# Patient Record
Sex: Male | Born: 1990
Health system: Southern US, Community
[De-identification: ages and names within clinical notes are randomized; demographics above are authoritative.]

## PROBLEM LIST (undated history)

## (undated) DIAGNOSIS — I429 Cardiomyopathy, unspecified: Secondary | ICD-10-CM

## (undated) HISTORY — PX: CARDIAC DEFIBRILLATOR PLACEMENT: SHX171

## (undated) HISTORY — PX: CARDIAC CATHETERIZATION: SHX172

## (undated) HISTORY — PX: MYRINGOTOMY: SUR874

---
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
## 2008-02-21 ENCOUNTER — Emergency Department (HOSPITAL_COMMUNITY): Admission: EM | Admit: 2008-02-21 | Discharge: 2008-02-21 | Payer: Self-pay | Admitting: Emergency Medicine

## 2009-03-07 ENCOUNTER — Emergency Department (HOSPITAL_COMMUNITY): Admission: EM | Admit: 2009-03-07 | Discharge: 2009-03-07 | Payer: Self-pay | Admitting: Emergency Medicine

## 2009-09-29 ENCOUNTER — Emergency Department (HOSPITAL_COMMUNITY): Admission: EM | Admit: 2009-09-29 | Discharge: 2009-09-29 | Payer: Self-pay | Admitting: Emergency Medicine

## 2009-12-05 ENCOUNTER — Ambulatory Visit (HOSPITAL_COMMUNITY): Admission: RE | Admit: 2009-12-05 | Discharge: 2009-12-05 | Payer: Self-pay | Admitting: Family Medicine

## 2010-02-20 ENCOUNTER — Emergency Department (HOSPITAL_COMMUNITY): Admission: EM | Admit: 2010-02-20 | Discharge: 2010-02-21 | Payer: Self-pay | Admitting: Emergency Medicine

## 2010-10-30 ENCOUNTER — Emergency Department (HOSPITAL_COMMUNITY)
Admission: EM | Admit: 2010-10-30 | Discharge: 2010-10-30 | Disposition: A | Discharge status: Home / Self Care | Payer: Self-pay | Attending: Emergency Medicine | Admitting: Emergency Medicine

## 2010-10-30 ENCOUNTER — Emergency Department (HOSPITAL_COMMUNITY): Payer: Self-pay

## 2010-10-30 DIAGNOSIS — R0789 Other chest pain: Secondary | ICD-10-CM | POA: Insufficient documentation

## 2010-10-30 DIAGNOSIS — I428 Other cardiomyopathies: Secondary | ICD-10-CM | POA: Insufficient documentation

## 2010-10-30 DIAGNOSIS — R0602 Shortness of breath: Secondary | ICD-10-CM | POA: Insufficient documentation

## 2010-10-30 DIAGNOSIS — I10 Essential (primary) hypertension: Secondary | ICD-10-CM | POA: Insufficient documentation

## 2010-10-30 DIAGNOSIS — Z950 Presence of cardiac pacemaker: Secondary | ICD-10-CM | POA: Insufficient documentation

## 2010-10-30 LAB — DIFFERENTIAL
Basophils Absolute: 0 10*3/uL (ref 0.0–0.1)
Basophils Relative: 0 % (ref 0–1)
Eosinophils Absolute: 0.2 10*3/uL (ref 0.0–0.7)
Eosinophils Relative: 3 % (ref 0–5)
Lymphocytes Relative: 33 % (ref 12–46)
Lymphs Abs: 2.3 10*3/uL (ref 0.7–4.0)
Monocytes Absolute: 0.6 10*3/uL (ref 0.1–1.0)
Monocytes Relative: 8 % (ref 3–12)
Neutro Abs: 3.9 10*3/uL (ref 1.7–7.7)
Neutrophils Relative %: 56 % (ref 43–77)

## 2010-10-30 LAB — BRAIN NATRIURETIC PEPTIDE: Pro B Natriuretic peptide (BNP): <30 pg/mL (ref 0.0–100.0)

## 2010-10-30 LAB — POCT CARDIAC MARKERS
CKMB, poc: <1 ng/mL — ABNORMAL LOW (ref 1.0–8.0)
CKMB, poc: <1 ng/mL — ABNORMAL LOW (ref 1.0–8.0)
Myoglobin, poc: 41.9 ng/mL (ref 12–200)
Myoglobin, poc: 44.7 ng/mL (ref 12–200)
Troponin i, poc: <0.05 ng/mL (ref 0.00–0.09)
Troponin i, poc: <0.05 ng/mL (ref 0.00–0.09)

## 2010-10-30 LAB — BASIC METABOLIC PANEL
BUN: 12 mg/dL (ref 6–23)
CO2: 29 mEq/L (ref 19–32)
Calcium: 9.4 mg/dL (ref 8.4–10.5)
Chloride: 101 mEq/L (ref 96–112)
Creatinine, Ser: 0.99 mg/dL (ref 0.4–1.5)
GFR calc Af Amer: >60 mL/min (ref >60)
GFR calc non Af Amer: >60 mL/min (ref >60)
Glucose, Bld: 90 mg/dL (ref 70–99)
Potassium: 4 mEq/L (ref 3.5–5.1)
Sodium: 138 mEq/L (ref 135–145)

## 2010-10-30 LAB — CBC
HCT: 45.9 % (ref 39.0–52.0)
Hemoglobin: 16 g/dL (ref 13.0–17.0)
MCH: 29.6 pg (ref 26.0–34.0)
MCHC: 34.9 g/dL (ref 30.0–36.0)
MCV: 84.8 fL (ref 78.0–100.0)
Platelets: 180 10*3/uL (ref 150–400)
RBC: 5.41 MIL/uL (ref 4.22–5.81)
RDW: 11.7 % (ref 11.5–15.5)
WBC: 6.9 10*3/uL (ref 4.0–10.5)

## 2010-11-02 ENCOUNTER — Emergency Department (HOSPITAL_COMMUNITY): Payer: Self-pay

## 2010-11-02 ENCOUNTER — Emergency Department (HOSPITAL_COMMUNITY)
Admission: EM | Admit: 2010-11-02 | Discharge: 2010-11-02 | Disposition: A | Discharge status: Home / Self Care | Payer: Self-pay | Attending: Emergency Medicine | Admitting: Emergency Medicine

## 2010-11-02 DIAGNOSIS — R0789 Other chest pain: Secondary | ICD-10-CM | POA: Insufficient documentation

## 2010-11-02 DIAGNOSIS — R112 Nausea with vomiting, unspecified: Secondary | ICD-10-CM | POA: Insufficient documentation

## 2010-11-02 DIAGNOSIS — Z95810 Presence of automatic (implantable) cardiac defibrillator: Secondary | ICD-10-CM | POA: Insufficient documentation

## 2010-11-02 DIAGNOSIS — I10 Essential (primary) hypertension: Secondary | ICD-10-CM | POA: Insufficient documentation

## 2010-11-02 LAB — DIFFERENTIAL
Basophils Absolute: 0 10*3/uL (ref 0.0–0.1)
Basophils Relative: 0 % (ref 0–1)
Eosinophils Absolute: 0.1 10*3/uL (ref 0.0–0.7)
Eosinophils Relative: 1 % (ref 0–5)
Lymphocytes Relative: 6 % — ABNORMAL LOW (ref 12–46)
Lymphs Abs: 0.8 10*3/uL (ref 0.7–4.0)
Monocytes Absolute: 0.7 10*3/uL (ref 0.1–1.0)
Monocytes Relative: 5 % (ref 3–12)
Neutro Abs: 12.1 10*3/uL — ABNORMAL HIGH (ref 1.7–7.7)
Neutrophils Relative %: 88 % — ABNORMAL HIGH (ref 43–77)

## 2010-11-02 LAB — COMPREHENSIVE METABOLIC PANEL
ALT: 62 U/L — ABNORMAL HIGH (ref 0–53)
AST: 28 U/L (ref 0–37)
Albumin: 4.2 g/dL (ref 3.5–5.2)
Alkaline Phosphatase: 56 U/L (ref 39–117)
BUN: 21 mg/dL (ref 6–23)
CO2: 27 mEq/L (ref 19–32)
Calcium: 9.3 mg/dL (ref 8.4–10.5)
Chloride: 104 mEq/L (ref 96–112)
Creatinine, Ser: 1.01 mg/dL (ref 0.4–1.5)
GFR calc Af Amer: >60 mL/min (ref >60)
GFR calc non Af Amer: >60 mL/min (ref >60)
Glucose, Bld: 103 mg/dL — ABNORMAL HIGH (ref 70–99)
Potassium: 4.2 mEq/L (ref 3.5–5.1)
Sodium: 139 mEq/L (ref 135–145)
Total Bilirubin: 0.7 mg/dL (ref 0.3–1.2)
Total Protein: 7.2 g/dL (ref 6.0–8.3)

## 2010-11-02 LAB — CBC
HCT: 47.9 % (ref 39.0–52.0)
Hemoglobin: 17 g/dL (ref 13.0–17.0)
MCH: 29.7 pg (ref 26.0–34.0)
MCHC: 35.5 g/dL (ref 30.0–36.0)
MCV: 83.7 fL (ref 78.0–100.0)
Platelets: 182 10*3/uL (ref 150–400)
RBC: 5.72 MIL/uL (ref 4.22–5.81)
RDW: 11.7 % (ref 11.5–15.5)
WBC: 13.7 10*3/uL — ABNORMAL HIGH (ref 4.0–10.5)

## 2010-11-02 LAB — URINALYSIS, ROUTINE W REFLEX MICROSCOPIC
Bilirubin Urine: NEGATIVE
Hgb urine dipstick: NEGATIVE
Ketones, ur: NEGATIVE mg/dL
Nitrite: NEGATIVE
Protein, ur: NEGATIVE mg/dL
Specific Gravity, Urine: >1.03 — ABNORMAL HIGH (ref 1.005–1.030)
Urine Glucose, Fasting: NEGATIVE mg/dL
Urobilinogen, UA: 0.2 mg/dL (ref 0.0–1.0)
pH: 5.5 (ref 5.0–8.0)

## 2010-11-02 LAB — LIPASE, BLOOD: Lipase: 29 U/L (ref 11–59)

## 2010-11-27 LAB — POCT CARDIAC MARKERS
CKMB, poc: <1 ng/mL — ABNORMAL LOW (ref 1.0–8.0)
CKMB, poc: <1 ng/mL — ABNORMAL LOW (ref 1.0–8.0)
Myoglobin, poc: 54.7 ng/mL (ref 12–200)
Myoglobin, poc: 68.2 ng/mL (ref 12–200)
Troponin i, poc: <0.05 ng/mL (ref 0.00–0.09)
Troponin i, poc: <0.05 ng/mL (ref 0.00–0.09)

## 2010-11-27 LAB — BASIC METABOLIC PANEL
BUN: 11 mg/dL (ref 6–23)
CO2: 26 mEq/L (ref 19–32)
Calcium: 9.3 mg/dL (ref 8.4–10.5)
Chloride: 107 mEq/L (ref 96–112)
Creatinine, Ser: 1.12 mg/dL (ref 0.4–1.5)
GFR calc Af Amer: >60 mL/min (ref >60)
GFR calc non Af Amer: >60 mL/min (ref >60)
Glucose, Bld: 87 mg/dL (ref 70–99)
Potassium: 3.3 mEq/L — ABNORMAL LOW (ref 3.5–5.1)
Sodium: 139 mEq/L (ref 135–145)

## 2010-11-27 LAB — DIFFERENTIAL
Basophils Absolute: 0 10*3/uL (ref 0.0–0.1)
Basophils Relative: 0 % (ref 0–1)
Eosinophils Absolute: 0.1 10*3/uL (ref 0.0–0.7)
Eosinophils Relative: 1 % (ref 0–5)
Lymphocytes Relative: 23 % (ref 12–46)
Lymphs Abs: 1.5 10*3/uL (ref 0.7–4.0)
Monocytes Absolute: 0.7 10*3/uL (ref 0.1–1.0)
Monocytes Relative: 10 % (ref 3–12)
Neutro Abs: 4.2 10*3/uL (ref 1.7–7.7)
Neutrophils Relative %: 65 % (ref 43–77)

## 2010-11-27 LAB — CBC
HCT: 45.5 % (ref 39.0–52.0)
Hemoglobin: 15.3 g/dL (ref 13.0–17.0)
MCHC: 33.6 g/dL (ref 30.0–36.0)
MCV: 88.4 fL (ref 78.0–100.0)
Platelets: 153 10*3/uL (ref 150–400)
RBC: 5.14 MIL/uL (ref 4.22–5.81)
RDW: 12 % (ref 11.5–15.5)
WBC: 6.4 10*3/uL (ref 4.0–10.5)

## 2010-12-18 LAB — BASIC METABOLIC PANEL
Calcium: 9.7 mg/dL (ref 8.4–10.5)
GFR calc Af Amer: >60 mL/min (ref >60)
GFR calc non Af Amer: >60 mL/min (ref >60)
Glucose, Bld: 83 mg/dL (ref 70–99)
Potassium: 4 mEq/L (ref 3.5–5.1)
Sodium: 138 mEq/L (ref 135–145)

## 2010-12-18 LAB — CBC
HCT: 48.6 % (ref 39.0–52.0)
Hemoglobin: 16.8 g/dL (ref 13.0–17.0)
RBC: 5.45 MIL/uL (ref 4.22–5.81)
WBC: 5.1 10*3/uL (ref 4.0–10.5)

## 2010-12-18 LAB — POCT CARDIAC MARKERS
CKMB, poc: <1 ng/mL — ABNORMAL LOW (ref 1.0–8.0)
Troponin i, poc: <0.05 ng/mL (ref 0.00–0.09)

## 2010-12-18 LAB — DIFFERENTIAL
Eosinophils Relative: 1 % (ref 0–5)
Lymphocytes Relative: 27 % (ref 12–46)
Lymphs Abs: 1.4 10*3/uL (ref 0.7–4.0)
Monocytes Absolute: 0.5 10*3/uL (ref 0.1–1.0)
Monocytes Relative: 9 % (ref 3–12)
Neutro Abs: 3.2 10*3/uL (ref 1.7–7.7)

## 2011-06-05 LAB — BASIC METABOLIC PANEL
BUN: 13
Chloride: 100
Creatinine, Ser: 1.34
GFR calc Af Amer: >60
GFR calc non Af Amer: >60
Potassium: 3.1 — ABNORMAL LOW

## 2011-06-05 LAB — CBC
HCT: 46.3
MCV: 88
Platelets: 216
RBC: 5.26
WBC: 10.9 — ABNORMAL HIGH

## 2011-06-05 LAB — DIFFERENTIAL
Basophils Relative: 0
Eosinophils Absolute: 0.1
Monocytes Relative: 7
Neutro Abs: 6.1
Neutrophils Relative %: 56

## 2011-06-05 LAB — CK TOTAL AND CKMB (NOT AT ARMC)
Relative Index: 1.1
Total CK: 149

## 2011-06-05 LAB — TROPONIN I: Troponin I: 0.01

## 2011-11-27 DIAGNOSIS — Z95810 Presence of automatic (implantable) cardiac defibrillator: Secondary | ICD-10-CM | POA: Insufficient documentation

## 2012-02-20 IMAGING — CR DG CHEST 2V
2 series · 2 of 2 positions shown · non-contrast
Comparison: 02/21/2011.

CLINICAL DATA: Chest pain.

CHEST - 2 VIEW

[view not recorded (1 of 2)]
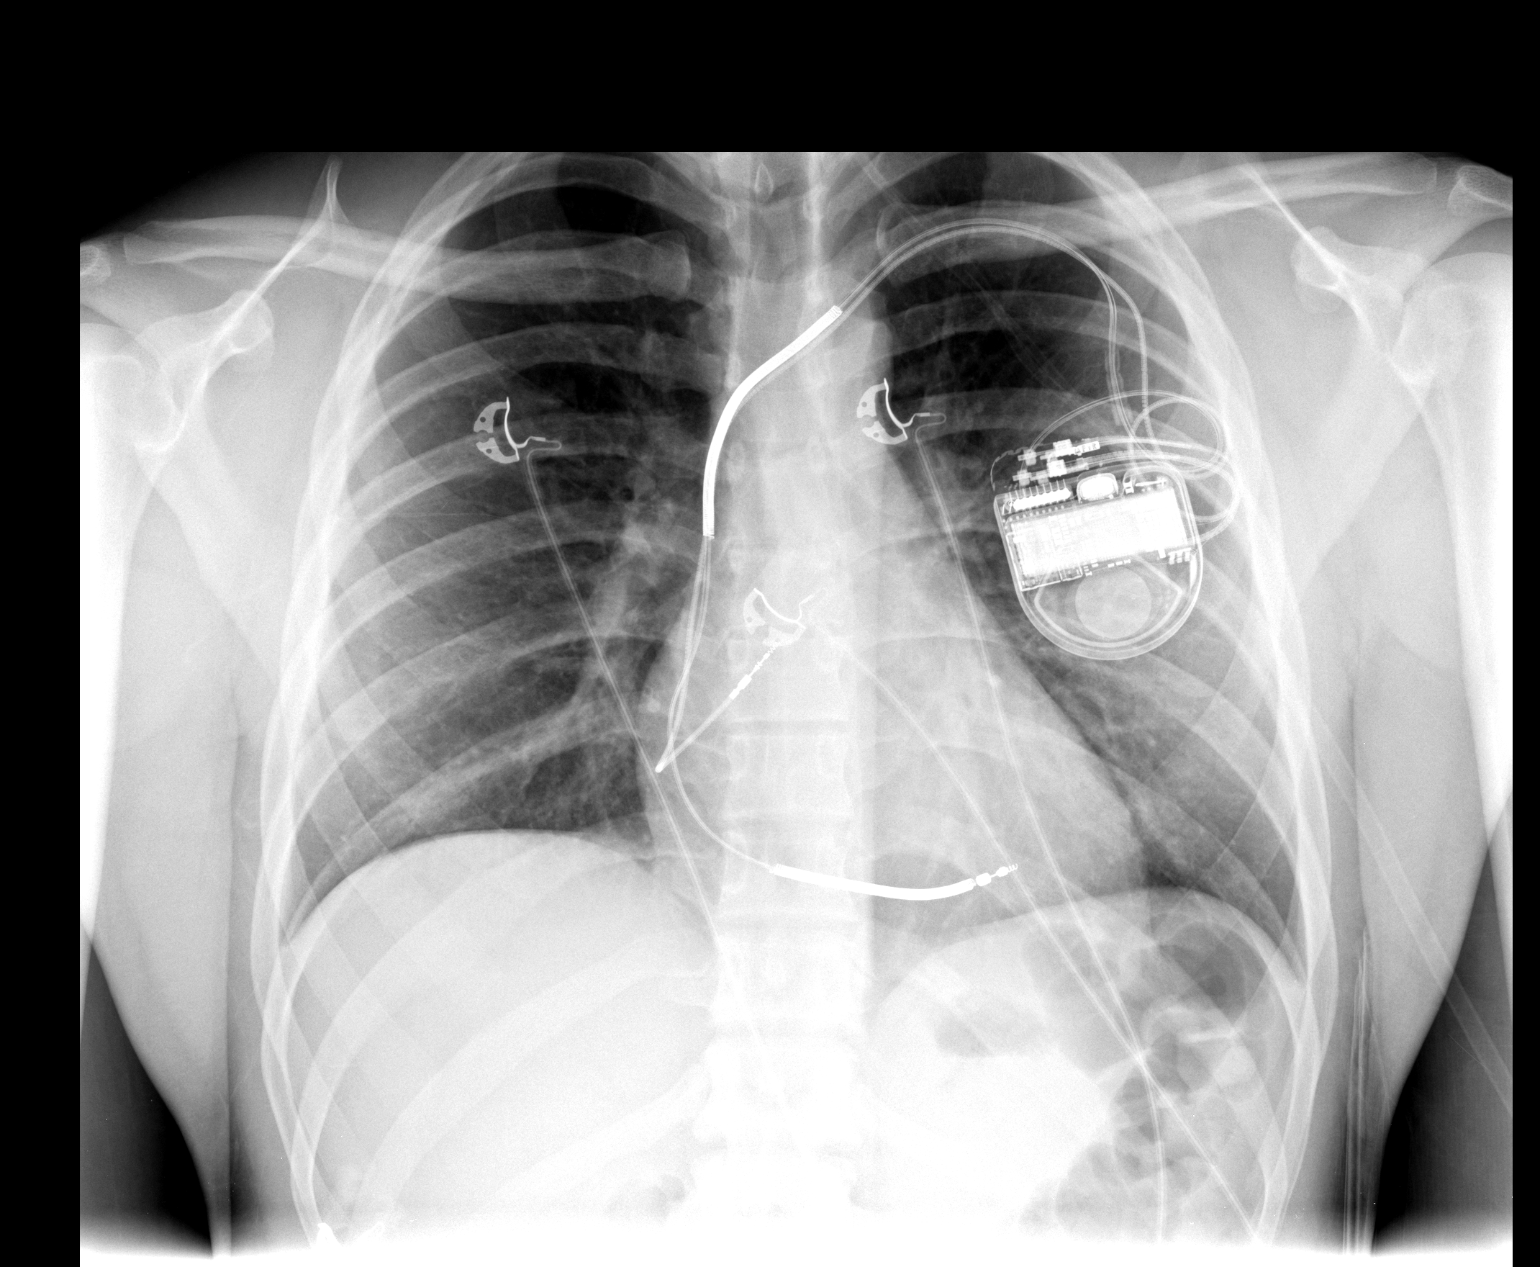

[view not recorded (2 of 2)]
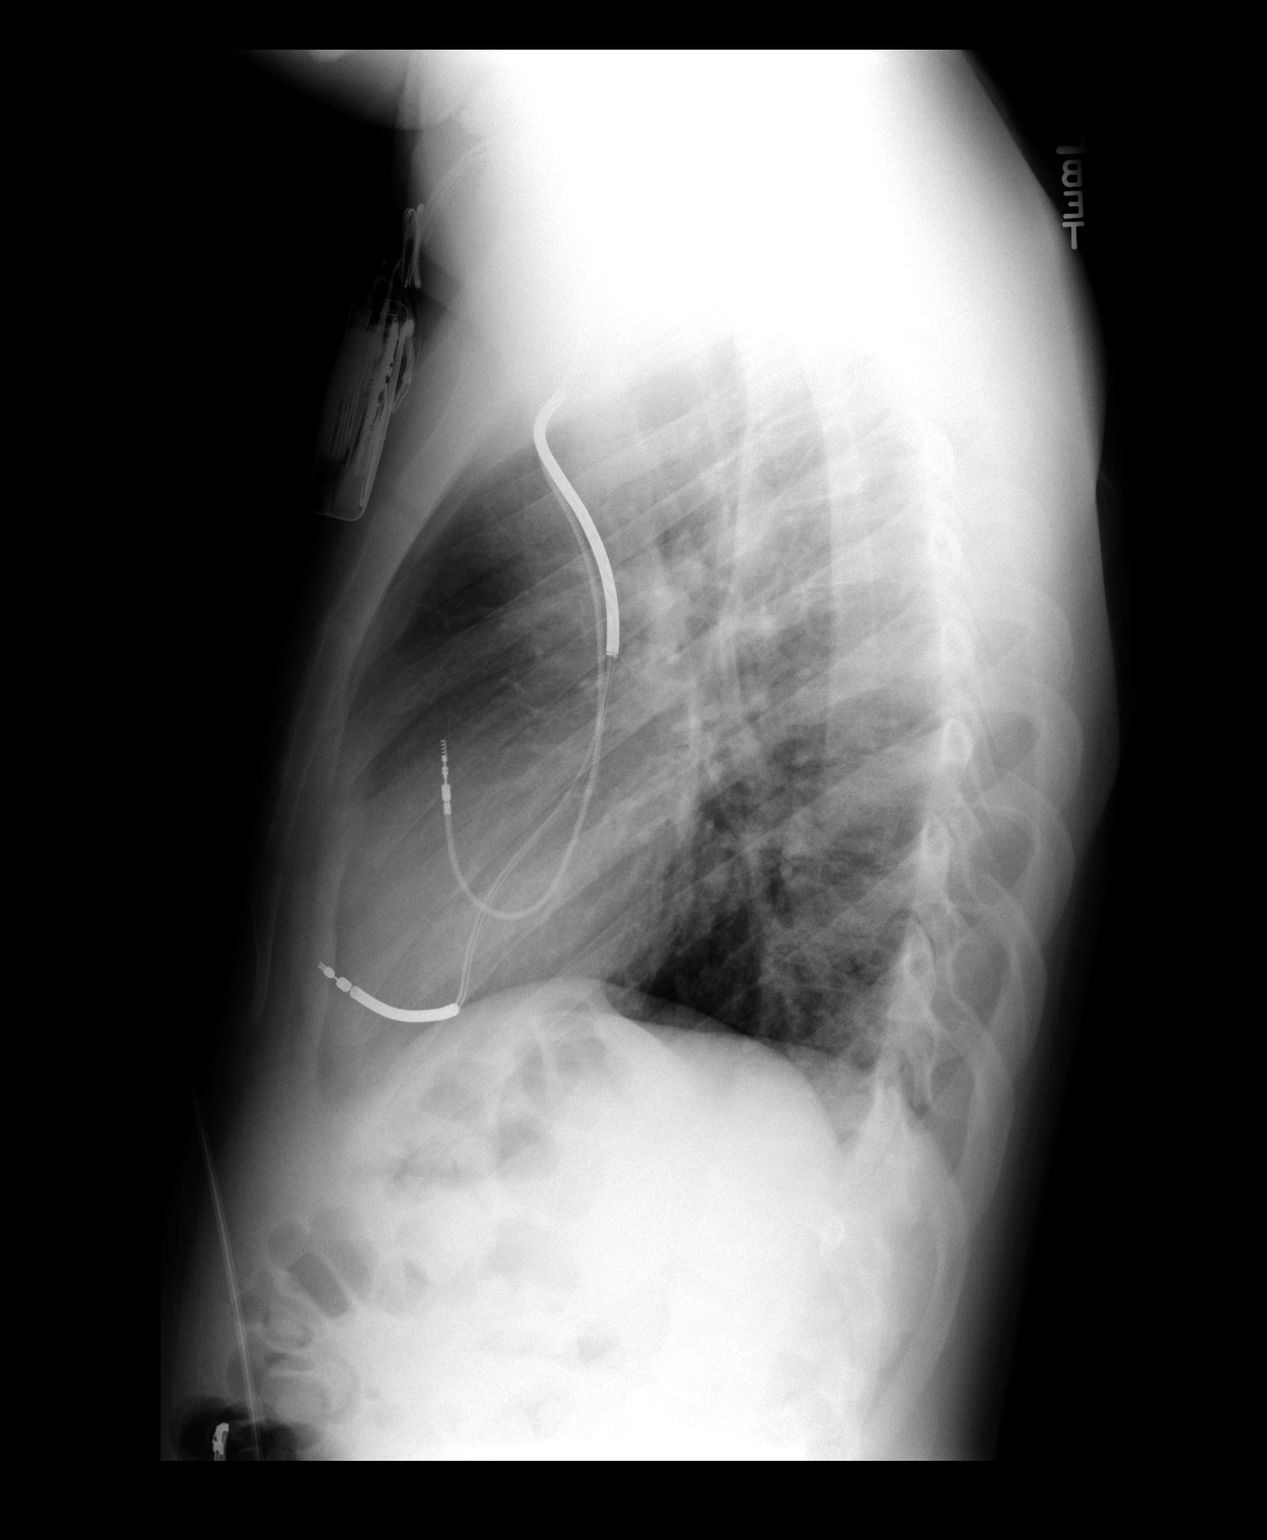

[2 of 2 positions shown; findings below may reference images not displayed]

FINDINGS: Cardiac leads and a left-sided pacemaker are stable in
position.  The heart is within normal limits for size.  The lungs
are clear.  The visualized soft tissues and bony thorax are
unremarkable.
IMPRESSION: No acute cardiopulmonary disease.

## 2012-02-23 IMAGING — CR DG CHEST 2V
2 series · 2 of 2 positions shown · non-contrast
Comparison: 10/30/2010.

CLINICAL DATA: Chest pain.

CHEST - 2 VIEW

[view not recorded (1 of 2)]
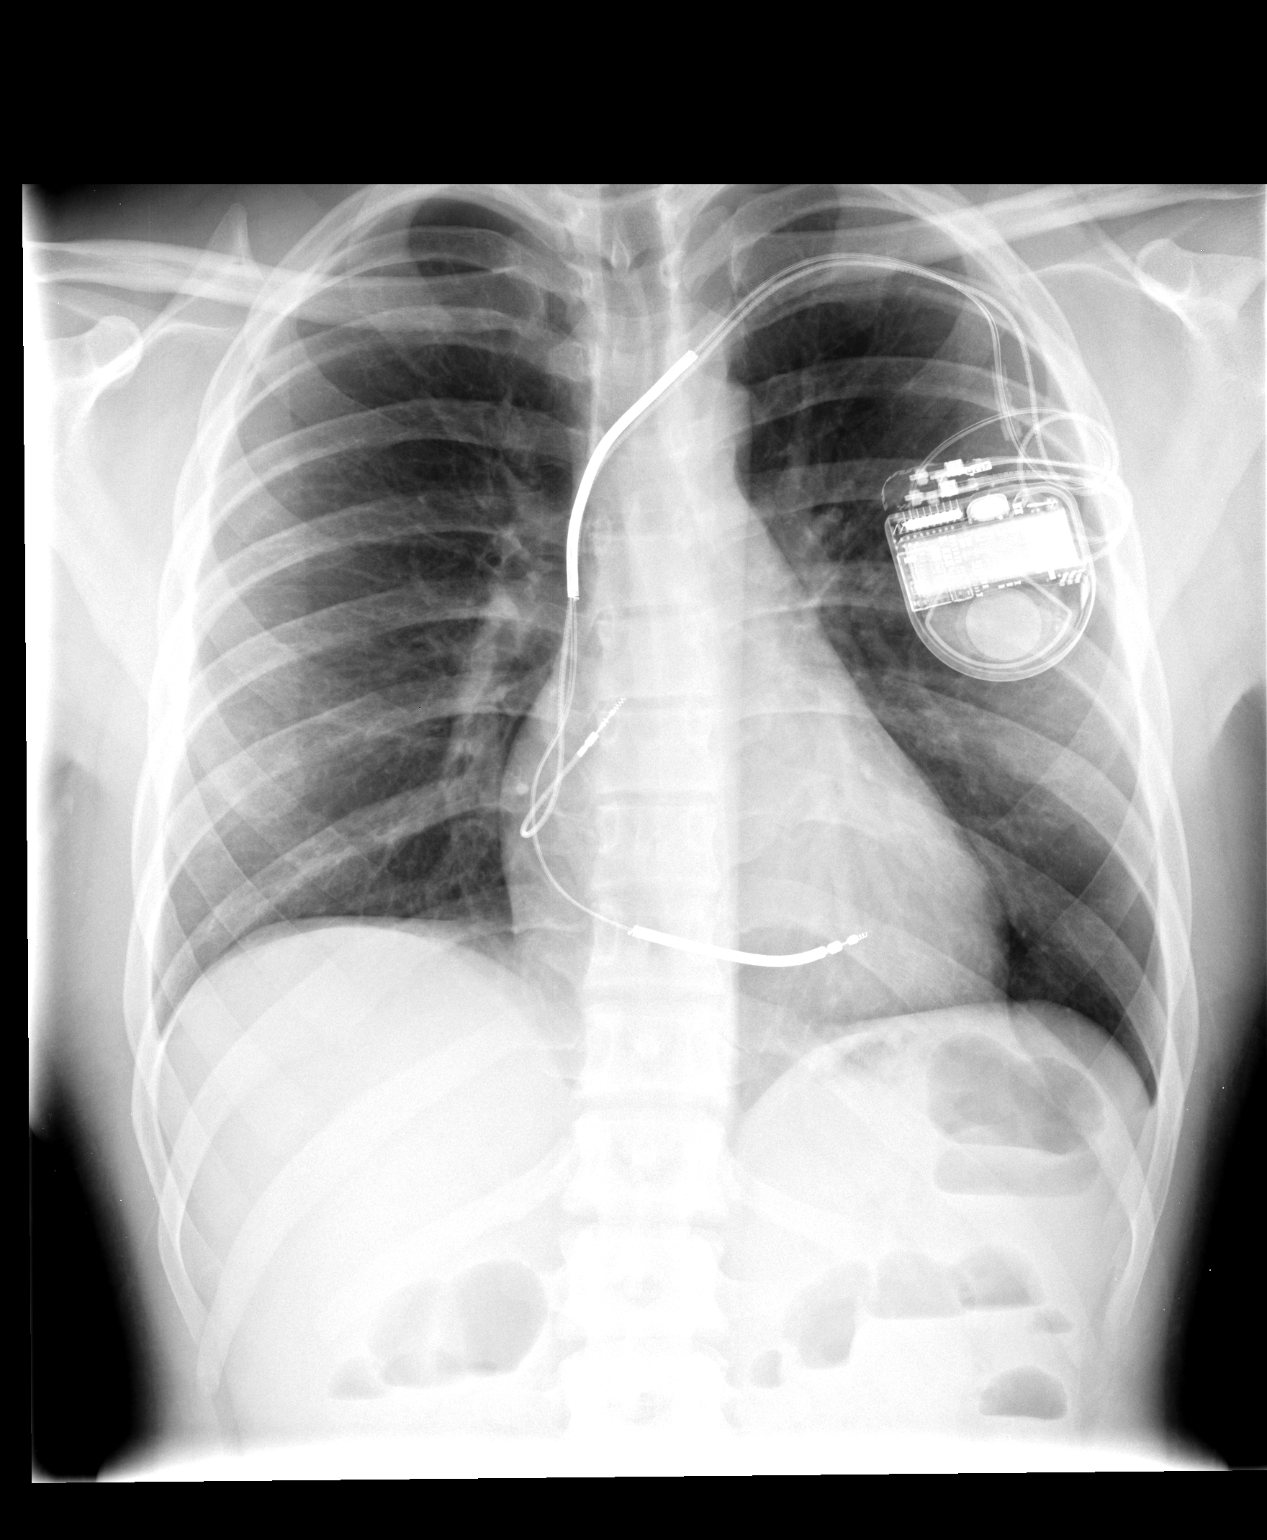

[view not recorded (2 of 2)]
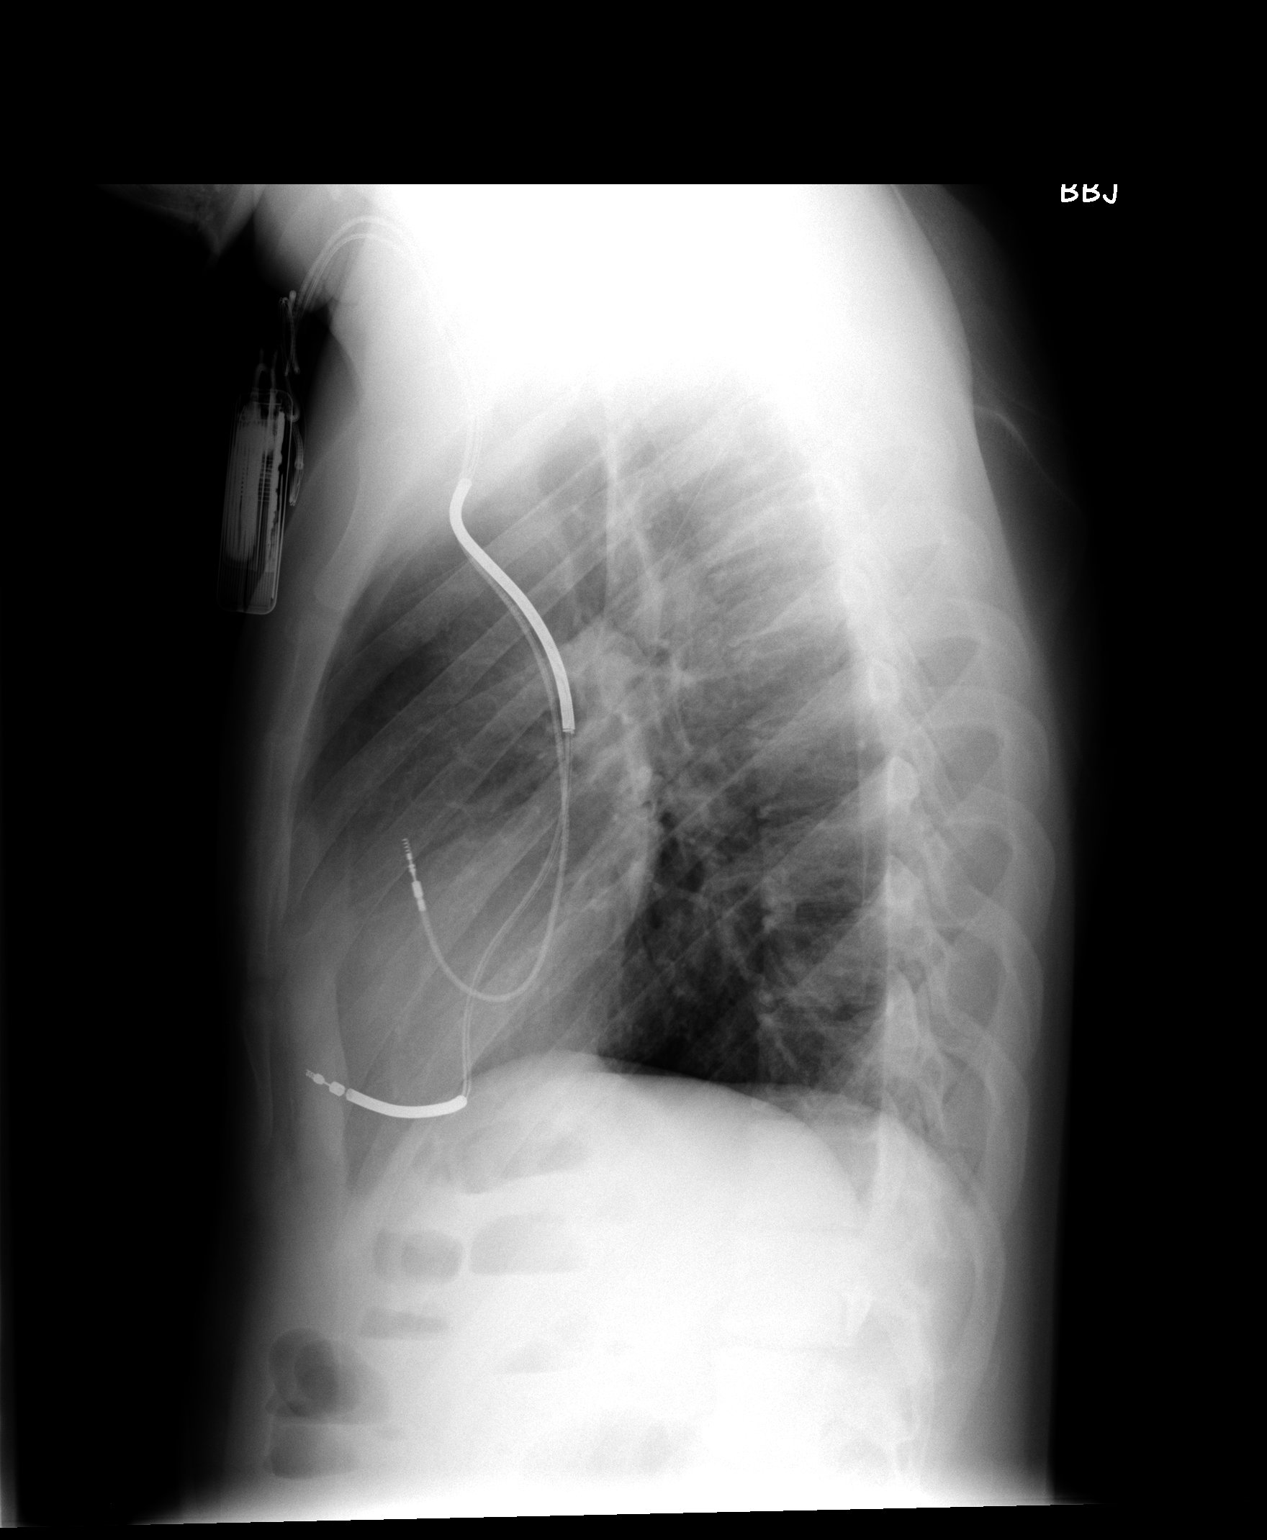

[2 of 2 positions shown; findings below may reference images not displayed]

FINDINGS: Heart size is normal.  Pacing and defibrillator leads are
stable.  The lungs are clear.  The visualized soft tissues and bony
thorax are unremarkable.
IMPRESSION: No acute cardiopulmonary disease or significant interval change.

## 2012-03-17 ENCOUNTER — Emergency Department
Admission: EM | Admit: 2012-03-17 | Discharge: 2012-03-17 | Disposition: A | Discharge status: Home / Self Care | Payer: 59 | Source: Home / Self Care

## 2012-03-17 ENCOUNTER — Encounter: Payer: Self-pay | Admitting: *Deleted

## 2012-03-17 DIAGNOSIS — IMO0002 Reserved for concepts with insufficient information to code with codable children: Secondary | ICD-10-CM

## 2012-03-17 DIAGNOSIS — L0291 Cutaneous abscess, unspecified: Secondary | ICD-10-CM

## 2012-03-17 HISTORY — DX: Cardiomyopathy, unspecified: I42.9

## 2012-03-17 NOTE — ED Notes (Signed)
Pt c/o abscess on his LT forearm x 1wk. He saw his PCP today and was given septra DS he has taken 2 tablets today. He is concerned that there is more swelling and pain.

## 2012-03-17 NOTE — ED Provider Notes (Signed)
History     CSN: [id number]  Arrival date & time 03/17/12  2003   First MD Initiated Contact with Patient 03/17/12 2005      Chief Complaint  Patient presents with  . Insect Bite    (Consider location/radiation/quality/duration/timing/severity/associated sxs/prior treatment) HPI Comments: Pt was seen earlier for this at [facility]  Was rxd bactrim, pt states that he took one dose and noticed worsening erythema since this point.  Abscess was not drained per pt.  Has had some mild elbow pain.  No distal paresthesias.  Full ROM.    Patient is a 21 y.o. male presenting with abscess.  Abscess  This is a new problem. The current episode started less than one week ago. The problem has been gradually worsening. Affected Location: L posterior forearm  The problem is mild. The abscess is characterized by redness, swelling and painfulness. Associated with: ? spider bite  The abscess first occurred at home. Pertinent negatives include no anorexia, no decrease in physical activity, no fever, no diarrhea and no vomiting.    Past Medical History  Diagnosis Date  . Cardiomyopathy     Past Surgical History  Procedure Date  . Cardiac defibrillator placement   . Myringotomy   . Cardiac catheterization     History reviewed. No pertinent family history.  History  Substance Use Topics  . Smoking status: Never Smoker   . Smokeless tobacco: Not on file  . Alcohol Use: No      Review of Systems  Constitutional: Negative for fever.  Gastrointestinal: Negative for vomiting, diarrhea and anorexia.  All other systems reviewed and are negative.    Allergies  Review of patient's allergies indicates no known allergies.  Home Medications   Current Outpatient Rx  Name Route Sig Dispense Refill  . ASPIRIN 81 MG PO TABS Oral Take 81 mg by mouth daily.    Marland Kitchen CARVEDILOL 12.5 MG PO TABS Oral Take 12.5 mg by mouth 2 (two) times daily with a meal.    . SOTALOL HCL 120 MG PO TABS Oral Take 120  mg by mouth 2 (two) times daily.    . SULFAMETHOXAZOLE-TMP DS 800-160 MG PO TABS Oral Take 1 tablet by mouth 2 (two) times daily.      BP 136/82  Pulse 76  Temp 98.7 F (37.1 C) (Oral)  Resp 18  Ht 5\' 5"  (1.651 m)  Wt 170 lb (77.111 kg)  BMI 28.29 kg/m2  SpO2 100%  Physical Exam  Constitutional: He appears well-developed and well-nourished.  HENT:  Head: Normocephalic and atraumatic.  Eyes: Pupils are equal, round, and reactive to light.  Neck: Normal range of motion. Neck supple.  Cardiovascular: Normal rate.   Pulmonary/Chest: Effort normal and breath sounds normal.  Skin:           <0.5 central area of pustular drainage with 2-3 cm area of peripheral erythema and fluctuance.  Elbow Full ROM. Mild elbow tednerness. No crepitus.     ED Course  Procedures (including critical care time)   Labs Reviewed  WOUND CULTURE   No results found.   No diagnosis found.    MDM  Abscess drained at bedside and cultured. Minimal amount of fluid expressed.  Will continue bactrim pending cultures.  Discussed MSK and infectious red flags including worsening elbow pain and forearm swelling.  Handout given. Follow up in 3-5 days if not improved.     The patient and/or caregiver has been counseled thoroughly with regard to treatment plan and/or  medications prescribed including dosage, schedule, interactions, rationale for use, and possible side effects and they verbalize understanding. Diagnoses and expected course of recovery discussed and will return if not improved as expected or if the condition worsens. Patient and/or caregiver verbalized understanding.             Floydene Flock, MD 03/17/12 2104

## 2012-03-18 NOTE — ED Provider Notes (Signed)
Agree with exam, assessment, and plan.   Lattie Haw, MD 03/18/12 1318

## 2012-03-21 ENCOUNTER — Emergency Department (INDEPENDENT_AMBULATORY_CARE_PROVIDER_SITE_OTHER)
Admission: EM | Admit: 2012-03-21 | Discharge: 2012-03-21 | Disposition: A | Discharge status: Home / Self Care | Payer: 59 | Source: Home / Self Care

## 2012-03-21 ENCOUNTER — Encounter: Payer: Self-pay | Admitting: Emergency Medicine

## 2012-03-21 ENCOUNTER — Telehealth: Payer: Self-pay

## 2012-03-21 DIAGNOSIS — L0291 Cutaneous abscess, unspecified: Secondary | ICD-10-CM

## 2012-03-21 DIAGNOSIS — Z950 Presence of cardiac pacemaker: Secondary | ICD-10-CM

## 2012-03-21 DIAGNOSIS — IMO0002 Reserved for concepts with insufficient information to code with codable children: Secondary | ICD-10-CM

## 2012-03-21 LAB — WOUND CULTURE: Gram Stain: NONE SEEN

## 2012-03-21 MED ORDER — DOXYCYCLINE HYCLATE 100 MG PO CAPS: 100.0000 mg | ORAL_CAPSULE | Freq: Two times a day (BID) | ORAL | Status: AC

## 2012-03-21 MED ORDER — CLINDAMYCIN HCL 300 MG PO CAPS: 300.0000 mg | ORAL_CAPSULE | Freq: Three times a day (TID) | ORAL | Status: AC

## 2012-03-21 NOTE — ED Notes (Signed)
I called and spoke with Roger Parsons about his positive wound culture and advised him to finish the full course of antibiotics.

## 2012-03-21 NOTE — ED Notes (Signed)
Seen 03-17-12 for spot on left arm that was infected; now states it is draining a lot and there is another spot on upper arm that has appeared. Reports ABX giving him heartburn.

## 2012-03-21 NOTE — ED Provider Notes (Signed)
History     CSN: [id number]  Arrival date & time 03/21/12  1123   First MD Initiated Contact with Patient 03/21/12 1125      Chief Complaint  Patient presents with  . Wound Check   HPI Comments: Pt also has new area of redness and pustule formation on L upper arm.  Pt with baseline hx/o pace maker placement 3-4 months ago for cardiomyopathy.  No fevers or chills at home.  Pt does report feeling warm 1-2 days prior to this visit. Pt also states that bactrim has given him feeling of nausea.      Patient is a 21 y.o. male presenting with wound check.  Wound Check  He was treated in the ED 3 to 5 days ago. Previous treatment in the ED includes I&D of abscess. Treatments since wound repair include oral antibiotics. Fever duration: no fever  Wound drainage status: seropurulent  The redness has worsened. Swelling Status: mild  Pain course: mild  He has no difficulty moving (no elbow pain or swelling. Area of I&D has reaccumulated purulent fluid. ) the affected extremity or digit.    Past Medical History  Diagnosis Date  . Cardiomyopathy     Past Surgical History  Procedure Date  . Cardiac defibrillator placement   . Myringotomy   . Cardiac catheterization     History reviewed. No pertinent family history.  History  Substance Use Topics  . Smoking status: Never Smoker   . Smokeless tobacco: Not on file  . Alcohol Use: No      Review of Systems  All other systems reviewed and are negative.    Allergies  Review of patient's allergies indicates no known allergies.  Home Medications   Current Outpatient Rx  Name Route Sig Dispense Refill  . ASPIRIN 81 MG PO TABS Oral Take 81 mg by mouth daily.    Marland Kitchen CARVEDILOL 12.5 MG PO TABS Oral Take 12.5 mg by mouth 2 (two) times daily with a meal.    . CLINDAMYCIN HCL 300 MG PO CAPS Oral Take 1 capsule (300 mg total) by mouth 3 (three) times daily. 30 capsule 0  . DOXYCYCLINE HYCLATE 100 MG PO CAPS Oral Take 1 capsule (100 mg  total) by mouth 2 (two) times daily. 20 capsule 0  . SOTALOL HCL 120 MG PO TABS Oral Take 120 mg by mouth 2 (two) times daily.      BP 118/75  Pulse 76  Temp 98.1 F (36.7 C) (Oral)  Resp 16  Ht 5\' 5"  (1.651 m)  Wt 163 lb 5 oz (74.078 kg)  BMI 27.18 kg/m2  SpO2 98%  Physical Exam  Constitutional: He appears well-developed and well-nourished.  HENT:  Head: Normocephalic and atraumatic.  Right Ear: External ear normal.  Left Ear: External ear normal.  Eyes: Conjunctivae are normal. Pupils are equal, round, and reactive to light.  Neck: Normal range of motion. Neck supple.  Cardiovascular: Normal rate and regular rhythm.        Cardiac pacemaker site investigated.  Scar well healed.  No peripheral erythema or purulent drainage.   Pulmonary/Chest: Effort normal and breath sounds normal.  Abdominal: Soft.  Skin:          L elbow: 1.5x1.5 cm area of peripheral erythema and purulent drainage. Depth of wound approx 0.5 cm deep.   L upper arm: <0.5 cm area of pustular formation and peripheral erythema. No fluctuance.     ED Course  Procedures (including critical care time)  Labs Reviewed  WOUND CULTURE  WOUND CULTURE   No results found.   No diagnosis found.    MDM  Abscess recurrence Wound culture with MSSA thus far. Areas manually drained and recultured at bedside.  Will transition abx to clinda and doxy for improved coverage.  Discussed infectious red flags at length.  Plan to follow up in 48 hours or sooner if sxs fail to improve.  Pacemaker site currently reassuring.  No current signs of systemic infection on exam.  L elbow is reassuring with good ROM and no pain.  Would consider referral back to cardiologist vs. Hospital admission for IV abx if abscesses recur as pacemaker may be nidus.  Handout given.     The patient and/or caregiver has been counseled thoroughly with regard to treatment plan and/or medications prescribed including dosage, schedule,  interactions, rationale for use, and possible side effects and they verbalize understanding. Diagnoses and expected course of recovery discussed and will return if not improved as expected or if the condition worsens. Patient and/or caregiver verbalized understanding.             Floydene Flock, MD 03/22/12 1610  Floydene Flock, MD 03/22/12 [phone]

## 2012-03-23 ENCOUNTER — Emergency Department
Admission: EM | Admit: 2012-03-23 | Discharge: 2012-03-23 | Disposition: A | Discharge status: Home / Self Care | Payer: 59 | Source: Home / Self Care

## 2012-03-23 DIAGNOSIS — Z4800 Encounter for change or removal of nonsurgical wound dressing: Secondary | ICD-10-CM

## 2012-03-23 DIAGNOSIS — Z5189 Encounter for other specified aftercare: Secondary | ICD-10-CM

## 2012-03-23 NOTE — ED Notes (Signed)
Wound re check. 

## 2012-03-23 NOTE — ED Provider Notes (Signed)
History     CSN: [id number]  Arrival date & time 03/23/12  1618   First MD Initiated Contact with Patient 03/23/12 1625      Chief Complaint  Patient presents with  . Dressing Change   HPI Comments: Pt here for follow up of L elbow wound.  Was recently transitioned to clindamycin and doxycycline from bactrim as L elbow wound failed to improve from initial visit on 03/17/12.  Area was manually drained and packed with iodoform gauze. There was also noted new small abscess on L upper arm.   Pt states that sxs are much improved.   No fevers, chills, joint pain. Has tolerated abx with no reported nausea or abdominal pain.   Has pending follow up with [facility] for cardiomyopathy s/p pacemaker.  Pacemaker site C/D/I. No redness, pain, or erythema.    Patient is a 21 y.o. male presenting with wound check.  Wound Check  He was treated in the ED 2 to 3 days ago. Previous treatment in the ED includes wound cleansing or irrigation and oral antibiotics. Treatments since wound repair include oral antibiotics. Fever duration: no fever  Wound drainage status: minimal drainage  There is no redness present. There is no swelling present. The pain has no pain.    Past Medical History  Diagnosis Date  . Cardiomyopathy     Past Surgical History  Procedure Date  . Cardiac defibrillator placement   . Myringotomy   . Cardiac catheterization     History reviewed. No pertinent family history.  History  Substance Use Topics  . Smoking status: Never Smoker   . Smokeless tobacco: Not on file  . Alcohol Use: No      Review of Systems  All other systems reviewed and are negative.    Allergies  Review of patient's allergies indicates no known allergies.  Home Medications   Current Outpatient Rx  Name Route Sig Dispense Refill  . ASPIRIN 81 MG PO TABS Oral Take 81 mg by mouth daily.    Marland Kitchen CARVEDILOL 12.5 MG PO TABS Oral Take 12.5 mg by mouth 2 (two) times daily with a meal.    .  CLINDAMYCIN HCL 300 MG PO CAPS Oral Take 1 capsule (300 mg total) by mouth 3 (three) times daily. 30 capsule 0  . DOXYCYCLINE HYCLATE 100 MG PO CAPS Oral Take 1 capsule (100 mg total) by mouth 2 (two) times daily. 20 capsule 0  . SOTALOL HCL 120 MG PO TABS Oral Take 120 mg by mouth 2 (two) times daily.      BP 122/83  Pulse 70  Temp 98.2 F (36.8 C) (Oral)  Resp 18  Wt 166 lb (75.297 kg)  SpO2 98%  Physical Exam  Constitutional: He appears well-developed and well-nourished.  HENT:  Head: Normocephalic and atraumatic.  Eyes: Conjunctivae are normal. Pupils are equal, round, and reactive to light.  Neck: Normal range of motion. Neck supple.  Cardiovascular: Normal rate and regular rhythm.   Pulmonary/Chest: Effort normal and breath sounds normal.  Abdominal: Soft. Bowel sounds are normal.  Musculoskeletal: Normal range of motion.  Skin: Skin is warm.    ED Course  Procedures (including critical care time)  Labs Reviewed - No data to display No results found.   No diagnosis found. Recent Results (from the past 240 hour(s))  WOUND CULTURE     Status: Normal   Collection Time   03/17/12  8:54 PM      Component Value Range Status  Comment   Culture Moderate STAPHYLOCOCCUS AUREUS   Final    GRAM STAIN No WBC Seen   Final    GRAM STAIN No Squamous Epithelial Cells Seen   Final    GRAM STAIN No Organisms Seen   Final    Organism ID, Bacteria STAPHYLOCOCCUS AUREUS   Final   WOUND CULTURE     Status: Normal (Preliminary result)   Collection Time   03/21/12 12:23 PM      Component Value Range Status Comment   GRAM STAIN Rare   Preliminary    GRAM STAIN WBC present-predominately Mononuclear   Preliminary    GRAM STAIN No Squamous Epithelial Cells Seen   Preliminary    GRAM STAIN No Organisms Seen   Preliminary    Preliminary Report Few STAPHYLOCOCCUS AUREUS   Preliminary   WOUND CULTURE     Status: Normal (Preliminary result)   Collection Time   03/21/12 12:24 PM      Component  Value Range Status Comment   GRAM STAIN No WBC Seen   Preliminary    GRAM STAIN No Squamous Epithelial Cells Seen   Preliminary    GRAM STAIN No Organisms Seen   Preliminary    Preliminary Report NO GROWTH 1 DAY   Preliminary      MDM  Clinically improving elbow wound.  L upper arm abscess resolved.  L elbow wound repacked and redressed.  Continue clinda and doxy.  No signs of systemic infection.  Discussed infectious red flags.  Follow up in 3-5 days to reevaluate.     The patient and/or caregiver has been counseled thoroughly with regard to treatment plan and/or medications prescribed including dosage, schedule, interactions, rationale for use, and possible side effects and they verbalize understanding. Diagnoses and expected course of recovery discussed and will return if not improved as expected or if the condition worsens. Patient and/or caregiver verbalized understanding.              Floydene Flock, MD 03/23/12 [phone]

## 2012-03-24 ENCOUNTER — Telehealth: Payer: Self-pay | Admitting: Family Medicine

## 2012-03-24 ENCOUNTER — Emergency Department (INDEPENDENT_AMBULATORY_CARE_PROVIDER_SITE_OTHER)
Admission: EM | Admit: 2012-03-24 | Discharge: 2012-03-24 | Disposition: A | Discharge status: Home / Self Care | Payer: 59 | Source: Home / Self Care | Attending: Family Medicine | Admitting: Family Medicine

## 2012-03-24 DIAGNOSIS — Z4801 Encounter for change or removal of surgical wound dressing: Secondary | ICD-10-CM

## 2012-03-24 LAB — WOUND CULTURE
Gram Stain: NONE SEEN
Gram Stain: NONE SEEN
Gram Stain: NONE SEEN
Gram Stain: NONE SEEN

## 2012-03-24 NOTE — ED Notes (Signed)
Packing placed in left elbow lesion yesterday accidentally pulled out today. Here for evaluation of need for more.

## 2012-03-24 NOTE — ED Provider Notes (Signed)
Agree with exam, assessment, and plan.   Lattie Haw, MD 03/24/12 1012

## 2012-03-24 NOTE — ED Provider Notes (Signed)
Agree with exam, assessment, and plan.   Lattie Haw, MD 03/24/12 1011

## 2012-03-24 NOTE — ED Provider Notes (Signed)
History     CSN: [id number]  Arrival date & time 03/24/12  1541   First MD Initiated Contact with Patient 03/24/12 [phone]      Chief Complaint  Patient presents with  . Dressing Change     HPI Comments: Patient presents for dressing change to abscess left elbow.  He states that packing came out yesterday when he changed bandage.  He denies pain and notes that there has been minimal drainage from the area.  The history is provided by the patient.    Past Medical History  Diagnosis Date  . Cardiomyopathy     Past Surgical History  Procedure Date  . Cardiac defibrillator placement   . Myringotomy   . Cardiac catheterization     No family history on file.  History  Substance Use Topics  . Smoking status: Never Smoker   . Smokeless tobacco: Not on file  . Alcohol Use: No      Review of Systems No fevers, chills, and sweats. No chest pain No nausea/vomiting   Allergies  Review of patient's allergies indicates no known allergies.  Home Medications   Current Outpatient Rx  Name Route Sig Dispense Refill  . ASPIRIN 81 MG PO TABS Oral Take 81 mg by mouth daily.    Marland Kitchen CARVEDILOL 12.5 MG PO TABS Oral Take 12.5 mg by mouth 2 (two) times daily with a meal.    . CLINDAMYCIN HCL 300 MG PO CAPS Oral Take 1 capsule (300 mg total) by mouth 3 (three) times daily. 30 capsule 0  . DOXYCYCLINE HYCLATE 100 MG PO CAPS Oral Take 1 capsule (100 mg total) by mouth 2 (two) times daily. 20 capsule 0  . SOTALOL HCL 120 MG PO TABS Oral Take 120 mg by mouth 2 (two) times daily.      Temp 98.2 F (36.8 C) (Oral)  Physical Exam Nursing notes and Vital Signs reviewed. Appearance:  Patient appears healthy, stated age, and in no acute distress Left arm:  (see photo previous note).  In vicinity of olecranon is a shallow ulcer about 8mm dia without purulent drainage or surrounding erythema/tenderness.    ED Course  Procedures  none      1. Dressing change or removal, surgical wound;   Wound appears to be healing well and cellulitis clearing      MDM  Wound redressed.  Change dressing daily.  Keep wound clean and dry.  Finish antibiotics.  Return for increasing signs of infection or failure to improve.        Lattie Haw, MD 03/24/12 [phone]

## 2012-07-31 ENCOUNTER — Emergency Department
Admission: EM | Admit: 2012-07-31 | Discharge: 2012-07-31 | Disposition: A | Discharge status: Home / Self Care | Payer: 59 | Source: Home / Self Care

## 2012-07-31 ENCOUNTER — Encounter: Payer: Self-pay | Admitting: Emergency Medicine

## 2012-07-31 DIAGNOSIS — R040 Epistaxis: Secondary | ICD-10-CM

## 2012-07-31 LAB — POCT CBC W AUTO DIFF (K'VILLE URGENT CARE)

## 2012-07-31 MED ORDER — AMOXICILLIN-POT CLAVULANATE 875-125 MG PO TABS: 1.0000 | ORAL_TABLET | Freq: Two times a day (BID) | ORAL | Status: DC

## 2012-07-31 NOTE — ED Provider Notes (Signed)
History     CSN: [id number]  Arrival date & time 07/31/12  1804   First MD Initiated Contact with Patient 07/31/12 1815      Chief Complaint  Patient presents with  . Epistaxis    Patient is a 21 y.o. male presenting with nosebleeds.  Epistaxis  This is a new problem. The current episode started yesterday. The problem occurs constantly. The problem has not changed since onset.The problem is associated with aspirin. The bleeding has been from the left nare. Treatments tried: nasal saline  The treatment provided no relief. His past medical history is significant for frequent nosebleeds (in childhood, resolved as an adult ). His past medical history does not include nose-picking. Past medical history comments: cardiomyopahty followed by [facility], on beta blocker, ASA.    Past Medical History  Diagnosis Date  . Cardiomyopathy     Past Surgical History  Procedure Date  . Cardiac defibrillator placement   . Myringotomy   . Cardiac catheterization     No family history on file.  History  Substance Use Topics  . Smoking status: Never Smoker   . Smokeless tobacco: Not on file  . Alcohol Use: No      Review of Systems  HENT: Positive for nosebleeds.   All other systems reviewed and are negative.    Allergies  Review of patient's allergies indicates no known allergies.  Home Medications   Current Outpatient Rx  Name  Route  Sig  Dispense  Refill  . AMOXICILLIN-POT CLAVULANATE 875-125 MG PO TABS   Oral   Take 1 tablet by mouth 2 (two) times daily.   20 tablet   0   . ASPIRIN 81 MG PO TABS   Oral   Take 81 mg by mouth daily.         Marland Kitchen CARVEDILOL 12.5 MG PO TABS   Oral   Take 12.5 mg by mouth 2 (two) times daily with a meal.         . SOTALOL HCL 120 MG PO TABS   Oral   Take 120 mg by mouth 2 (two) times daily.           BP 140/82  Pulse 70  Temp 97.6 F (36.4 C) (Oral)  Resp 18  Ht 5\' 4"  (1.626 m)  Wt 180 lb (81.647 kg)  BMI 30.90 kg/m2  SpO2  100%  Physical Exam  Constitutional: He appears well-developed and well-nourished.  HENT:  Head: Atraumatic.  Right Ear: External ear normal.  Left Ear: External ear normal.  Eyes: Conjunctivae normal are normal. Pupils are equal, round, and reactive to light.  Neck: Normal range of motion. Neck supple.  Cardiovascular: Normal rate, regular rhythm and normal heart sounds.   Pulmonary/Chest: Effort normal.  Abdominal: Soft.  Musculoskeletal: Normal range of motion.  Neurological: He is alert.  Skin: Skin is warm.    ED Course  EPISTAXIS MANAGEMENT Performed by: Doree Albee Authorized by: Doree Albee Consent: Verbal consent obtained. Risks and benefits: risks, benefits and alternatives were discussed Local anesthetic: lidocaine 2% without epinephrine Anesthetic total: 0.5 ml Repair method: rhino rocket  Treatment complexity: simple Comments: Conservative measurements intially attempted including pt blowing nose, leaning forward. Nasal afrin x 2 sprayed into L nare. Compression applied by pt x 5 minutes. Epistaxis resolved after conservative measures. Rhino-rocket placed as prophylactic measure.    (including critical care time)  Labs Reviewed - No data to display No results found.   1. Epistaxis  MDM  Rhino rocket placed.  Plan for ENT follow up in am.  Augmentin given for abx coverage.  Discussed general and ENT red flags.      The patient and/or caregiver has been counseled thoroughly with regard to treatment plan and/or medications prescribed including dosage, schedule, interactions, rationale for use, and possible side effects and they verbalize understanding. Diagnoses and expected course of recovery discussed and will return if not improved as expected or if the condition worsens. Patient and/or caregiver verbalized understanding.              Doree Albee, MD 07/31/12 [phone]

## 2012-07-31 NOTE — ED Notes (Signed)
Epistaxis on and off for 2 days, left nostril

## 2012-08-01 ENCOUNTER — Telehealth: Payer: Self-pay | Admitting: *Deleted

## 2013-11-03 ENCOUNTER — Emergency Department (HOSPITAL_COMMUNITY): Payer: BC Managed Care – PPO

## 2013-11-03 ENCOUNTER — Emergency Department (HOSPITAL_COMMUNITY)
Admission: EM | Admit: 2013-11-03 | Discharge: 2013-11-03 | Disposition: A | Discharge status: Home / Self Care | Payer: BC Managed Care – PPO | Attending: Emergency Medicine | Admitting: Emergency Medicine

## 2013-11-03 ENCOUNTER — Encounter (HOSPITAL_COMMUNITY): Payer: Self-pay | Admitting: Emergency Medicine

## 2013-11-03 DIAGNOSIS — K2100 Gastro-esophageal reflux disease with esophagitis, without bleeding: Secondary | ICD-10-CM

## 2013-11-03 DIAGNOSIS — K219 Gastro-esophageal reflux disease without esophagitis: Secondary | ICD-10-CM | POA: Insufficient documentation

## 2013-11-03 DIAGNOSIS — Z95810 Presence of automatic (implantable) cardiac defibrillator: Secondary | ICD-10-CM | POA: Insufficient documentation

## 2013-11-03 DIAGNOSIS — Z8679 Personal history of other diseases of the circulatory system: Secondary | ICD-10-CM | POA: Insufficient documentation

## 2013-11-03 DIAGNOSIS — Z9889 Other specified postprocedural states: Secondary | ICD-10-CM | POA: Insufficient documentation

## 2013-11-03 DIAGNOSIS — Z79899 Other long term (current) drug therapy: Secondary | ICD-10-CM | POA: Insufficient documentation

## 2013-11-03 DIAGNOSIS — R42 Dizziness and giddiness: Secondary | ICD-10-CM | POA: Insufficient documentation

## 2013-11-03 LAB — I-STAT TROPONIN, ED: TROPONIN I, POC: 0.01 ng/mL (ref 0.00–0.08)

## 2013-11-03 LAB — BASIC METABOLIC PANEL
BUN: 13 mg/dL (ref 6–23)
CALCIUM: 9.3 mg/dL (ref 8.4–10.5)
CO2: 28 meq/L (ref 19–32)
CREATININE: 1.07 mg/dL (ref 0.50–1.35)
Chloride: 102 mEq/L (ref 96–112)
GFR calc non Af Amer: >90 mL/min (ref >90)
Glucose, Bld: 101 mg/dL — ABNORMAL HIGH (ref 70–99)
Potassium: 3.9 mEq/L (ref 3.7–5.3)
Sodium: 139 mEq/L (ref 137–147)

## 2013-11-03 LAB — CBC
HEMATOCRIT: 45 % (ref 39.0–52.0)
Hemoglobin: 15.7 g/dL (ref 13.0–17.0)
MCH: 30.1 pg (ref 26.0–34.0)
MCHC: 34.9 g/dL (ref 30.0–36.0)
MCV: 86.4 fL (ref 78.0–100.0)
PLATELETS: 180 10*3/uL (ref 150–400)
RBC: 5.21 MIL/uL (ref 4.22–5.81)
RDW: 11.9 % (ref 11.5–15.5)
WBC: 8.9 10*3/uL (ref 4.0–10.5)

## 2013-11-03 MED ORDER — PANTOPRAZOLE SODIUM 40 MG PO TBEC
40.0000 mg | DELAYED_RELEASE_TABLET | Freq: Once | ORAL | Status: AC
  Administered 2013-11-03: 40 mg via ORAL
  Filled 2013-11-03: qty 1

## 2013-11-03 MED ORDER — OMEPRAZOLE 20 MG PO CPDR: 20.0000 mg | DELAYED_RELEASE_CAPSULE | Freq: Every day | ORAL | Status: DC

## 2013-11-03 NOTE — ED Provider Notes (Signed)
CSN: [id number]     Arrival date & time 11/03/13  1732 History   First MD Initiated Contact with Patient 11/03/13 1827     Chief Complaint  Patient presents with  . Chest Pain     (Consider location/radiation/quality/duration/timing/severity/associated sxs/prior Treatment) HPI Pt reporting at 1600 was walking and developed dizziness. Went to sit down developed chest tightness, was nauseated. EMS came but pt did not want transport  symptoms occurred after he'd and some [employer] chicken.. Pt reports continued chest pressure but is not as bad. No dizziness or nausea currently. Pt is a x 4. Skin warm and dry. 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 Denies defib shock.  He did have symptoms of food coming up into his throat.  Past Medical History  Diagnosis Date  . Cardiomyopathy    Past Surgical History  Procedure Laterality Date  . Cardiac defibrillator placement    . Myringotomy    . Cardiac catheterization     No family history on file. History  Substance Use Topics  . Smoking status: Never Smoker   . Smokeless tobacco: Not on file  . Alcohol Use: No    Review of Systems  All other systems reviewed and are negative  Allergies  Review of patient's allergies indicates no known allergies.  Home Medications   Current Outpatient Rx  Name  Route  Sig  Dispense  Refill  . carvedilol (COREG) 12.5 MG tablet   Oral   Take 12.5 mg by mouth 2 (two) times daily with a meal.         . sotalol (BETAPACE) 120 MG tablet   Oral   Take 120 mg by mouth 2 (two) times daily.         Marland Kitchen. omeprazole (PRILOSEC) 20 MG capsule   Oral   Take 1 capsule (20 mg total) by mouth daily.   30 capsule   0    BP 123/74  Pulse 70  Temp(Src) 98.2 F (36.8 C) (Oral)  Resp 13  Ht 5\' 4"  (1.626 m)  Wt 180 lb (81.647 kg)  BMI 30.88 kg/m2  SpO2 98% Physical Exam  Nursing note and vitals reviewed. Constitutional: He is oriented to person, place, and time. He appears well-developed and  well-nourished. No distress.  HENT:  Head: Normocephalic and atraumatic.  Eyes: Pupils are equal, round, and reactive to light.  Neck: Normal range of motion.  Cardiovascular: Normal rate and intact distal pulses.   Pulmonary/Chest: No respiratory distress.  Abdominal: Normal appearance. He exhibits no distension.  Musculoskeletal: Normal range of motion.  Neurological: He is alert and oriented to person, place, and time. No cranial nerve deficit.  Skin: Skin is warm and dry. No rash noted.  Psychiatric: He has a normal mood and affect. His behavior is normal.   Medications  pantoprazole (PROTONIX) EC tablet 40 mg (not administered)     ED Course  Procedures (including critical care time) Labs Review Labs Reviewed  BASIC METABOLIC PANEL - Abnormal; Notable for the following:    Glucose, Bld 101 (*)    All other components within normal limits  CBC  I-STAT TROPOININ, ED   Imaging Review Dg Chest 2 View  11/03/2013   CLINICAL DATA:  Chest pain and shortness of breath.  EXAM: CHEST  2 VIEW  COMPARISON:  11/02/2010  FINDINGS: The lungs are clear without focal consolidation, edema, effusion or pneumothorax. Cardio pericardial silhouette is within normal limits for size. Imaged bony structures of the thorax are intact.  Left-sided dual lead pacer/AICD remains in place. Telemetry leads overlie the chest.  IMPRESSION: Stable.  No acute findings.   Electronically Signed   By: Kennith Center M.D.   On: 11/03/2013 19:31    EKG Interpretation    Date/Time:  Tuesday November 03 2013 17:35:30 EST Ventricular Rate:  73 PR Interval:  160 QRS Duration: 102 QT Interval:  368 QTC Calculation: 405 R Axis:   85 Text Interpretation:  Normal sinus rhythm T wave abnormality, consider anterior ischemia Abnormal ECG no changed from prior Confirmed by HORTON  MD, COURTNEY ([id number]) on 11/03/2013 5:45:50 PM           Patient did not want to stay for his second blood tests.  I explained that this  would increase the likelihood of a negative workup to less than 2% but he elected to be discharged and stated he would come back if his symptoms return.  Patient was asymptomatic at time of discharge. MDM   Final diagnoses:  Reflux esophagitis        Nelia Shi, MD 11/03/13 2041

## 2013-11-03 NOTE — Discharge Instructions (Signed)
Diet for Gastroesophageal Reflux Disease, Adult °Reflux is when stomach acid flows up into the esophagus. The esophagus becomes irritated and sore (inflammation). When reflux happens often and is severe, it is called gastroesophageal reflux disease (GERD). What you eat can help ease any discomfort caused by GERD. °FOODS OR DRINKS TO AVOID OR LIMIT °· Coffee and black tea, with or without caffeine. °· Bubbly (carbonated) drinks with caffeine or energy drinks. °· Strong spices, such as pepper, cayenne pepper, curry, or chili powder. °· Peppermint or spearmint. °· Chocolate. °· High-fat foods, such as meats, fried food, oils, butter, or nuts. °· Fruits and vegetables that cause discomfort. This includes citrus fruits and tomatoes. °· Alcohol. °If a certain food or drink irritates your GERD, avoid eating or drinking it. °THINGS THAT MAY HELP GERD INCLUDE: °· Eat meals slowly. °· Eat 5 to 6 small meals a day, not 3 large meals. °· Do not eat food for a certain amount of time if it causes discomfort. °· Wait 3 hours after eating before lying down. °· Keep the head of your bed raised 6 to 9 inches (15 23 centimeters). Put a foam wedge or blocks under the legs of the bed. °· Stay active. Weight loss, if needed, may help ease your discomfort. °· Wear loose-fitting clothing. °· Do not smoke or chew tobacco. °Document Released: 02/26/2012 Document Reviewed: 02/26/2012 °ExitCare® Patient Information ©2014 [employer]. ° °Gastroesophageal Reflux Disease, Adult °Gastroesophageal reflux disease (GERD) happens when acid from your stomach flows up into the esophagus. When acid comes in contact with the esophagus, the acid causes soreness (inflammation) in the esophagus. Over time, GERD may create small holes (ulcers) in the lining of the esophagus. °CAUSES  °· Increased body weight. This puts pressure on the stomach, making acid rise from the stomach into the esophagus. °· Smoking. This increases acid production in the  stomach. °· Drinking alcohol. This causes decreased pressure in the lower esophageal sphincter (valve or ring of muscle between the esophagus and stomach), allowing acid from the stomach into the esophagus. °· Late evening meals and a full stomach. This increases pressure and acid production in the stomach. °· A malformed lower esophageal sphincter. °Sometimes, no cause is found. °SYMPTOMS  °· Burning pain in the lower part of the mid-chest behind the breastbone and in the mid-stomach area. This may occur twice a week or more often. °· Trouble swallowing. °· Sore throat. °· Dry cough. °· Asthma-like symptoms including chest tightness, shortness of breath, or wheezing. °DIAGNOSIS  °Your caregiver may be able to diagnose GERD based on your symptoms. In some cases, X-rays and other tests may be done to check for complications or to check the condition of your stomach and esophagus. °TREATMENT  °Your caregiver may recommend over-the-counter or prescription medicines to help decrease acid production. Ask your caregiver before starting or adding any new medicines.  °HOME CARE INSTRUCTIONS  °· Change the factors that you can control. Ask your caregiver for guidance concerning weight loss, quitting smoking, and alcohol consumption. °· Avoid foods and drinks that make your symptoms worse, such as: °· Caffeine or alcoholic drinks. °· Chocolate. °· Peppermint or mint flavorings. °· Garlic and onions. °· Spicy foods. °· Citrus fruits, such as oranges, lemons, or limes. °· Tomato-based foods such as sauce, chili, salsa, and pizza. °· Fried and fatty foods. °· Avoid lying down for the 3 hours prior to your bedtime or prior to taking a nap. °· Eat small, frequent meals instead of large meals. °·   Wear loose-fitting clothing. Do not wear anything tight around your waist that causes pressure on your stomach. °· Raise the head of your bed 6 to 8 inches with wood blocks to help you sleep. Extra pillows will not help. °· Only take  over-the-counter or prescription medicines for pain, discomfort, or fever as directed by your caregiver. °· Do not take aspirin, ibuprofen, or other nonsteroidal anti-inflammatory drugs (NSAIDs). °SEEK IMMEDIATE MEDICAL CARE IF:  °· You have pain in your arms, neck, jaw, teeth, or back. °· Your pain increases or changes in intensity or duration. °· You develop nausea, vomiting, or sweating (diaphoresis). °· You develop shortness of breath, or you faint. °· Your vomit is green, yellow, black, or looks like coffee grounds or blood. °· Your stool is red, bloody, or black. °These symptoms could be signs of other problems, such as heart disease, gastric bleeding, or esophageal bleeding. °MAKE SURE YOU:  °· Understand these instructions. °· Will watch your condition. °· Will get help right away if you are not doing well or get worse. °Document Released: 06/06/2005 Document Revised: 11/19/2011 Document Reviewed: 03/16/2011 °ExitCare® Patient Information ©2014 [employer]. ° °

## 2013-11-03 NOTE — ED Notes (Signed)
Pt reporting at 1600 was walking and developed dizziness. Went to sit down developed chest tightness, was nauseated. EMS came but pt did not want transport. Pt reports continued chest pressure but is not as bad. No dizziness or nausea currently. Pt is a x 4. Skin warm and dry. 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 Denies defib shock.

## 2014-04-08 DIAGNOSIS — I519 Heart disease, unspecified: Secondary | ICD-10-CM | POA: Insufficient documentation

## 2018-02-09 ENCOUNTER — Encounter (HOSPITAL_COMMUNITY): Payer: Self-pay | Admitting: Emergency Medicine

## 2018-02-09 ENCOUNTER — Other Ambulatory Visit: Payer: Self-pay

## 2018-02-09 ENCOUNTER — Emergency Department (HOSPITAL_COMMUNITY)
Admission: EM | Admit: 2018-02-09 | Discharge: 2018-02-09 | Disposition: A | Discharge status: Home / Self Care | Payer: Self-pay | Attending: Emergency Medicine | Admitting: Emergency Medicine

## 2018-02-09 DIAGNOSIS — R1010 Upper abdominal pain, unspecified: Secondary | ICD-10-CM | POA: Insufficient documentation

## 2018-02-09 DIAGNOSIS — Z5321 Procedure and treatment not carried out due to patient leaving prior to being seen by health care provider: Secondary | ICD-10-CM | POA: Insufficient documentation

## 2018-02-09 LAB — COMPREHENSIVE METABOLIC PANEL
ALK PHOS: 73 U/L (ref 38–126)
ALT: 40 U/L (ref 17–63)
ANION GAP: 8 (ref 5–15)
AST: 29 U/L (ref 15–41)
Albumin: 4.1 g/dL (ref 3.5–5.0)
BILIRUBIN TOTAL: 0.3 mg/dL (ref 0.3–1.2)
BUN: 9 mg/dL (ref 6–20)
CALCIUM: 9.2 mg/dL (ref 8.9–10.3)
CO2: 26 mmol/L (ref 22–32)
CREATININE: 1.1 mg/dL (ref 0.61–1.24)
Chloride: 103 mmol/L (ref 101–111)
GFR calc non Af Amer: >60 mL/min (ref >60)
Glucose, Bld: 108 mg/dL — ABNORMAL HIGH (ref 65–99)
Potassium: 4.2 mmol/L (ref 3.5–5.1)
Sodium: 137 mmol/L (ref 135–145)
TOTAL PROTEIN: 7.3 g/dL (ref 6.5–8.1)

## 2018-02-09 LAB — URINALYSIS, ROUTINE W REFLEX MICROSCOPIC
Bacteria, UA: NONE SEEN
Bilirubin Urine: NEGATIVE
GLUCOSE, UA: NEGATIVE mg/dL
Hgb urine dipstick: NEGATIVE
Ketones, ur: NEGATIVE mg/dL
NITRITE: NEGATIVE
Protein, ur: NEGATIVE mg/dL
SPECIFIC GRAVITY, URINE: 1.017 (ref 1.005–1.030)
pH: 5 (ref 5.0–8.0)

## 2018-02-09 LAB — CBC
HCT: 44.7 % (ref 39.0–52.0)
HEMOGLOBIN: 15.1 g/dL (ref 13.0–17.0)
MCH: 28.8 pg (ref 26.0–34.0)
MCHC: 33.8 g/dL (ref 30.0–36.0)
MCV: 85.3 fL (ref 78.0–100.0)
PLATELETS: 157 10*3/uL (ref 150–400)
RBC: 5.24 MIL/uL (ref 4.22–5.81)
RDW: 12.1 % (ref 11.5–15.5)
WBC: 7.6 10*3/uL (ref 4.0–10.5)

## 2018-02-09 LAB — LIPASE, BLOOD: Lipase: 35 U/L (ref 11–51)

## 2018-02-09 MED ORDER — ONDANSETRON 4 MG PO TBDP: 4.0000 mg | ORAL_TABLET | Freq: Once | ORAL | Status: DC | PRN

## 2018-02-09 NOTE — ED Triage Notes (Signed)
Patient is complaining of upper abdominal pain. Patient is also stating that he has had diarrhea had for three days. Patient states he had a fever last night and cold chills.

## 2018-03-28 ENCOUNTER — Ambulatory Visit (INDEPENDENT_AMBULATORY_CARE_PROVIDER_SITE_OTHER): Payer: Managed Care, Other (non HMO) | Admitting: Family Medicine

## 2018-03-28 ENCOUNTER — Encounter: Payer: Self-pay | Admitting: Family Medicine

## 2018-03-28 VITALS — BP 126/82 | Temp 98.4°F | Ht 64.0 in | Wt 199.4 lb

## 2018-03-28 DIAGNOSIS — J019 Acute sinusitis, unspecified: Secondary | ICD-10-CM | POA: Diagnosis not present

## 2018-03-28 DIAGNOSIS — I420 Dilated cardiomyopathy: Secondary | ICD-10-CM

## 2018-03-28 DIAGNOSIS — R6889 Other general symptoms and signs: Secondary | ICD-10-CM

## 2018-03-28 MED ORDER — AMOXICILLIN-POT CLAVULANATE 875-125 MG PO TABS: 1.0000 | ORAL_TABLET | Freq: Two times a day (BID) | ORAL | 0 refills | Status: DC

## 2018-03-28 NOTE — Progress Notes (Signed)
   Subjective:    Patient ID: Roger Parsons, male    DOB: 11/12/1990, 27 y.o.   MRN: [id number]  Cough  This is a new problem. The current episode started in the past 7 days. Associated symptoms include a fever, headaches, nasal congestion, rhinorrhea and a sore throat. Pertinent negatives include no chest pain, chills, ear pain or wheezing.  Patient relates a cough sometimes bringing up discolored phlegm symptoms present over the past couple days some chills body aches does not feel good denies tick bite denies rash has a history of cardiomyopathy and pediatric cardiology disease [facility]    Review of Systems  Constitutional: Positive for fever. Negative for activity change and chills.  HENT: Positive for congestion, rhinorrhea and sore throat. Negative for ear pain.   Eyes: Negative for discharge.  Respiratory: Positive for cough. Negative for wheezing.   Cardiovascular: Negative for chest pain.  Gastrointestinal: Negative for nausea and vomiting.  Musculoskeletal: Negative for arthralgias.  Neurological: Positive for headaches.       Objective:   Physical Exam  Constitutional: He appears well-developed.  HENT:  Head: Normocephalic.  Mouth/Throat: Oropharynx is clear and moist. No oropharyngeal exudate.  Neck: Normal range of motion.  Cardiovascular: Normal rate, regular rhythm and normal heart sounds.  No murmur heard. Pulmonary/Chest: Effort normal and breath sounds normal. He has no wheezes.  Lymphadenopathy:    He has no cervical adenopathy.  Neurological: He exhibits normal muscle tone.  Skin: Skin is warm and dry.  Nursing note and vitals reviewed.         Assessment & Plan:  Patient with body aches chills no documented fever but significant fatigue and tiredness along with cough that is sometimes productive of discolored phlegm other times not  This indicates a possibility of underlying viral illness with possible secondary  sinusitis I do not hear any pneumonia on this patient I recommend Augmentin 875 mg 1 twice daily over the course of the next 10 days this patient has upcoming cardiac surgery next week he should be able to get this done as long as his symptoms go away if he is not dramatically better by Monday he needs to let us know and we would do x-ray and lab work- x-rays and lab work are not indicated currently

## 2018-03-30 ENCOUNTER — Encounter (HOSPITAL_COMMUNITY): Payer: Self-pay | Admitting: *Deleted

## 2018-03-30 ENCOUNTER — Emergency Department (HOSPITAL_COMMUNITY): Payer: 59

## 2018-03-30 ENCOUNTER — Other Ambulatory Visit: Payer: Self-pay

## 2018-03-30 DIAGNOSIS — R05 Cough: Secondary | ICD-10-CM | POA: Diagnosis present

## 2018-03-30 DIAGNOSIS — J189 Pneumonia, unspecified organism: Secondary | ICD-10-CM | POA: Insufficient documentation

## 2018-03-30 MED ORDER — ACETAMINOPHEN 325 MG PO TABS
650.0000 mg | ORAL_TABLET | Freq: Once | ORAL | Status: AC | PRN
  Administered 2018-03-30: 650 mg via ORAL
  Filled 2018-03-30: qty 2

## 2018-03-30 NOTE — ED Triage Notes (Signed)
Pt c/o cough, headache, chills that started 3 days ago, unsure of fever, pt was seen by pcp on Friday and was diagnosed with "flu like illness" was placed on Augmentin but states that he is not getting any better,

## 2018-03-31 ENCOUNTER — Emergency Department (HOSPITAL_COMMUNITY)
Admission: EM | Admit: 2018-03-31 | Discharge: 2018-03-31 | Disposition: A | Discharge status: Home / Self Care | Payer: 59 | Attending: Emergency Medicine | Admitting: Emergency Medicine

## 2018-03-31 DIAGNOSIS — J189 Pneumonia, unspecified organism: Secondary | ICD-10-CM

## 2018-03-31 LAB — BASIC METABOLIC PANEL
ANION GAP: 10 (ref 5–15)
BUN: 11 mg/dL (ref 6–20)
CALCIUM: 8.5 mg/dL — AB (ref 8.9–10.3)
CO2: 25 mmol/L (ref 22–32)
Chloride: 101 mmol/L (ref 98–111)
Creatinine, Ser: 1.17 mg/dL (ref 0.61–1.24)
GFR calc Af Amer: >60 mL/min (ref >60)
GFR calc non Af Amer: >60 mL/min (ref >60)
GLUCOSE: 103 mg/dL — AB (ref 70–99)
Potassium: 3.9 mmol/L (ref 3.5–5.1)
Sodium: 136 mmol/L (ref 135–145)

## 2018-03-31 LAB — CBC WITH DIFFERENTIAL/PLATELET
BASOS ABS: 0 10*3/uL (ref 0.0–0.1)
Basophils Relative: 0 %
EOS ABS: 0.3 10*3/uL (ref 0.0–0.7)
EOS PCT: 3 %
HCT: 45 % (ref 39.0–52.0)
Hemoglobin: 15.3 g/dL (ref 13.0–17.0)
Lymphocytes Relative: 19 %
Lymphs Abs: 1.6 10*3/uL (ref 0.7–4.0)
MCH: 29.7 pg (ref 26.0–34.0)
MCHC: 34 g/dL (ref 30.0–36.0)
MCV: 87.4 fL (ref 78.0–100.0)
MONO ABS: 0.9 10*3/uL (ref 0.1–1.0)
Monocytes Relative: 11 %
NEUTROS ABS: 5.5 10*3/uL (ref 1.7–7.7)
NEUTROS PCT: 67 %
PLATELETS: 154 10*3/uL (ref 150–400)
RBC: 5.15 MIL/uL (ref 4.22–5.81)
RDW: 11.9 % (ref 11.5–15.5)
WBC: 8.3 10*3/uL (ref 4.0–10.5)

## 2018-03-31 MED ORDER — CEFTRIAXONE SODIUM 1 G IJ SOLR
1.0000 g | Freq: Once | INTRAMUSCULAR | Status: AC
  Administered 2018-03-31: 1 g via INTRAVENOUS
  Filled 2018-03-31: qty 10

## 2018-03-31 MED ORDER — GUAIFENESIN-CODEINE 100-10 MG/5ML PO SOLN: 10.0000 mL | Freq: Four times a day (QID) | ORAL | 0 refills | Status: DC | PRN

## 2018-03-31 MED ORDER — AZITHROMYCIN 250 MG PO TABS: 250.0000 mg | ORAL_TABLET | Freq: Every day | ORAL | 0 refills | Status: DC

## 2018-03-31 MED ORDER — SODIUM CHLORIDE 0.9 % IV SOLN
500.0000 mg | Freq: Once | INTRAVENOUS | Status: AC
  Administered 2018-03-31: 500 mg via INTRAVENOUS
  Filled 2018-03-31 (×2): qty 500

## 2018-03-31 MED ORDER — IBUPROFEN 800 MG PO TABS
800.0000 mg | ORAL_TABLET | Freq: Once | ORAL | Status: AC
  Administered 2018-03-31: 800 mg via ORAL
  Filled 2018-03-31: qty 1

## 2018-03-31 NOTE — ED Notes (Signed)
Pt given 2 blankets, per request

## 2018-03-31 NOTE — Discharge Instructions (Addendum)
Continue Augmentin as previously prescribed.  Begin taking Zithromax as prescribed this evening.  Follow-up with primary doctor if not improving in the next few days.

## 2018-03-31 NOTE — ED Provider Notes (Signed)
[facility] EMERGENCY DEPARTMENT Provider Note   CSN: [id number] Arrival date & time: 03/30/18  2057     History   Chief Complaint Chief Complaint  Patient presents with  . Cough    HPI ZYRUS HETLAND is a 27 y.o. male.  Patient is a 27 year old male with history of dilated cardiomyopathy with pacer/defibrillator.  He presents today with a one-week history of fever, cough, chills, body aches.  He was seen by his primary doctor on Friday and prescribed Augmentin, however is not improving.  He continues to be febrile and feels poorly.  He denies chest pain or leg pain or swelling.  The history is provided by the patient.  Cough  This is a new problem. Episode onset: 1 week ago. The problem occurs constantly. The problem has been gradually worsening. The cough is productive of sputum. The maximum temperature recorded prior to his arrival was 102 to 102.9 F. Associated symptoms include chills. Pertinent negatives include no shortness of breath. Treatments tried: Augmentin. The treatment provided no relief. He is not a smoker.    Past Medical History:  Diagnosis Date  . Cardiomyopathy [facility])     Patient Active Problem List   Diagnosis Date Noted  . Dilated cardiomyopathy (HCC) 03/28/2018    Past Surgical History:  Procedure Laterality Date  . CARDIAC CATHETERIZATION    . CARDIAC DEFIBRILLATOR PLACEMENT    . MYRINGOTOMY          Home Medications    Prior to Admission medications   Medication Sig Start Date End Date Taking? Authorizing Provider  amoxicillin-clavulanate (AUGMENTIN) 875-125 MG tablet Take 1 tablet by mouth 2 (two) times daily. 03/28/18   Babs Sciara, MD  carvedilol (COREG) 12.5 MG tablet Take 12.5 mg by mouth 2 (two) times daily with a meal.    [provider]  omeprazole (PRILOSEC) 20 MG capsule Take 1 capsule (20 mg total) by mouth daily. 11/03/13   Nelva Nay, MD  sotalol (BETAPACE) 120 MG tablet Take 120 mg by mouth 2 (two) times daily.     [provider]    Family History No family history on file.  Social History Social History   Tobacco Use  . Smoking status: Never Smoker  . Smokeless tobacco: Never Used  Substance Use Topics  . Alcohol use: No  . Drug use: No     Allergies   Patient has no known allergies.   Review of Systems Review of Systems  Constitutional: Positive for chills.  Respiratory: Positive for cough. Negative for shortness of breath.   All other systems reviewed and are negative.    Physical Exam Updated Vital Signs BP 134/71 (BP Location: Right Arm)   Pulse 93   Temp (!) 102.4 F (39.1 C) (Oral)   Resp (!) 22   Ht 5\' 4"  (1.626 m)   Wt 90.3 kg (199 lb)   SpO2 93%   BMI 34.16 kg/m   Physical Exam  Constitutional: He is oriented to person, place, and time. He appears well-developed and well-nourished. No distress.  HENT:  Head: Normocephalic and atraumatic.  Mouth/Throat: Oropharynx is clear and moist.  Neck: Normal range of motion. Neck supple.  Cardiovascular: Normal rate and regular rhythm. Exam reveals no friction rub.  No murmur heard. Pulmonary/Chest: Effort normal. No respiratory distress. He has no wheezes. He has rales.  There are slight rales in the bases bilaterally.  Abdominal: Soft. Bowel sounds are normal. He exhibits no distension. There is no tenderness.  Musculoskeletal: Normal range of motion. He exhibits no edema.  Neurological: He is alert and oriented to person, place, and time. Coordination normal.  Skin: Skin is warm and dry. He is not diaphoretic.  Nursing note and vitals reviewed.    ED Treatments / Results  Labs (all labs ordered are listed, but only abnormal results are displayed) Labs Reviewed  BASIC METABOLIC PANEL  CBC WITH DIFFERENTIAL/PLATELET    EKG None  Radiology Dg Chest 2 View  Result Date: 03/30/2018 CLINICAL DATA:  Cough, fever, and chills for several days. EXAM: CHEST - 2 VIEW COMPARISON:  11/03/2013 FINDINGS:  Heart size is normal. Dual lead transvenous pacemaker remains in appropriate position. Bilateral lower lobe infiltrates are seen, right side greater than left. No evidence of pleural effusion. IMPRESSION: Bilateral lower lobe infiltrates, suspicious for pneumonia. Electronically Signed   By: Myles RosenthalJohn  Stahl M.D.   On: 03/30/2018 23:12    Procedures Procedures (including critical care time)  Medications Ordered in ED Medications  cefTRIAXone (ROCEPHIN) 1 g in sodium chloride 0.9 % 100 mL IVPB (has no administration in time range)  azithromycin (ZITHROMAX) 500 mg in sodium chloride 0.9 % 250 mL IVPB (has no administration in time range)  acetaminophen (TYLENOL) tablet 650 mg (650 mg Oral Given 03/30/18 2117)     Initial Impression / Assessment and Plan / ED Course  I have reviewed the triage vital signs and the nursing notes.  Pertinent labs & imaging results that were available during my care of the patient were reviewed by me and considered in my medical decision making (see chart for details).  Patient currently taking Augmentin for a respiratory illness.  He presents today with continued productive cough and fever.  His x-ray reveals what appear to be bilateral lower lobe infiltrates.  He was given IV Rocephin and Zithromax.  He has no hypoxia and otherwise appears clinically well.  I see no indication for admission.  He will be discharged with Zithromax and return as needed.  Final Clinical Impressions(s) / ED Diagnoses   Final diagnoses:  None    ED Discharge Orders    None       Geoffery Lyonselo, Karis Emig, MD 03/31/18 [phone]

## 2018-03-31 NOTE — ED Notes (Signed)
Pt and family updated on plan of care,  

## 2018-03-31 NOTE — ED Notes (Signed)
ED Provider at bedside. 

## 2018-04-03 ENCOUNTER — Encounter: Payer: Self-pay | Admitting: Family Medicine

## 2018-04-03 ENCOUNTER — Ambulatory Visit (INDEPENDENT_AMBULATORY_CARE_PROVIDER_SITE_OTHER): Payer: Managed Care, Other (non HMO) | Admitting: Family Medicine

## 2018-04-03 ENCOUNTER — Other Ambulatory Visit (HOSPITAL_COMMUNITY)
Admission: RE | Admit: 2018-04-03 | Discharge: 2018-04-03 | Disposition: A | Discharge status: Home / Self Care | Payer: 59 | Source: Ambulatory Visit | Attending: Family Medicine | Admitting: Family Medicine

## 2018-04-03 ENCOUNTER — Other Ambulatory Visit: Payer: Self-pay | Admitting: Family Medicine

## 2018-04-03 VITALS — BP 136/82 | Temp 98.4°F | Ht 64.0 in | Wt 196.4 lb

## 2018-04-03 DIAGNOSIS — R6889 Other general symptoms and signs: Secondary | ICD-10-CM | POA: Diagnosis not present

## 2018-04-03 DIAGNOSIS — I420 Dilated cardiomyopathy: Secondary | ICD-10-CM | POA: Diagnosis not present

## 2018-04-03 DIAGNOSIS — J189 Pneumonia, unspecified organism: Secondary | ICD-10-CM | POA: Diagnosis not present

## 2018-04-03 DIAGNOSIS — R0602 Shortness of breath: Secondary | ICD-10-CM

## 2018-04-03 LAB — D-DIMER, QUANTITATIVE: D-Dimer, Quant: 0.46 ug/mL-FEU (ref 0.00–0.50)

## 2018-04-03 MED ORDER — BENZONATATE 100 MG PO CAPS: ORAL_CAPSULE | ORAL | 0 refills | Status: DC

## 2018-04-03 NOTE — Progress Notes (Signed)
   Subjective:    Patient ID: Roger Parsons, male    DOB: 08/05/1991, 27 y.o.   MRN: [id number]  HPI Pt here for ER follow up from 03/31/18. Pt was seen 7/19 for rhinosinusitis; pt states he took his med, went to sleep, and when he woke up he felt like he had been hit by a bus then hit by a tractor.   Wiped out and feverish and acheyu  Felt very poorly    Cough is still bad  Still has irritating cough   Non smoker   Complete hospital record reviewed in presence of patient.  Work-up in the hospital.  Had chest x-ray which revealed bilateral infiltrate.  In addition patient had pneumonia evident.  White blood count was normal on CBC.  Patient was given an injection of antibiotics and then Zithromax was added.  Reports feeling somewhat better.  Less shortness of breath.  Still having major aggravating cough    Pt was given another ABT and cough syrup at ED. Pt still has cough that is causing headache.   Review of Systems No headache, no major weight loss or weight gain, no chest pain no back pain abdominal pain no change in bowel habits complete ROS otherwise negative     Objective:   Physical Exam  Alert and oriented, vitals reviewed and stable, NAD ENT-TM's and ext canals WNL bilat via otoscopic exam Soft palate, tonsils and post pharynx WNL via oropharyngeal exam Neck-symmetric, no masses; thyroid nonpalpable and nontender Pulmonary-no tachypnea or accessory muscle use; Clear without wheezes via auscultation Card--no abnrml murmurs, rhythm reg and rate WNL Carotid pulses symmetric, without bruits Base of the lungs rare crackle no tachypnea breath sounds otherwise good.  No obvious wheezes      Assessment & Plan:  Impression persistent cough.  Patient's cardiac status is something on with concern.  History of cardiomyopathy and atrial fibrillation.  With patchy infiltrate and normal white blood count.  I would like to do a d-dimer.  Discussed with patient.  If elevated will  scan the chest.  If normal we will continue to treat his presumptive pneumonia.  Rationale discussed with patient.  Sent for d-dimer.  Addendum d-dimer came back within normal limits.  Maintain current antibiotics which includes finishing Augmentin.  Add LawyerTessalon Perles.  For cough  Greater than 50% of this 25 minute face to face visit was spent in counseling and discussion and coordination of care regarding the above diagnosis/diagnosies

## 2018-05-20 ENCOUNTER — Ambulatory Visit: Payer: Managed Care, Other (non HMO) | Admitting: Family Medicine

## 2019-07-20 ENCOUNTER — Telehealth: Payer: Self-pay | Admitting: Family Medicine

## 2019-07-20 NOTE — Telephone Encounter (Signed)
Patient was tested  for Covid on 11/5 and came back on 11/7/ as positive.He was tested at [facility]

## 2019-07-20 NOTE — Telephone Encounter (Signed)
yes

## 2019-07-20 NOTE — Telephone Encounter (Signed)
Pt states he feels fine. He has a feeling that he needs to cough but not really coughing, just a tickle in his throat. States first two days had a headache and achy throat before he was tested but states he is feeling fine now. No fever. No sob. Symptoms first started last Tuesday. Told pt to quarantine for 10 days from first day of symptoms so through this Friday.

## 2019-07-21 ENCOUNTER — Encounter: Payer: Self-pay | Admitting: Family Medicine

## 2019-07-21 ENCOUNTER — Ambulatory Visit (INDEPENDENT_AMBULATORY_CARE_PROVIDER_SITE_OTHER): Payer: HRSA Program | Admitting: Family Medicine

## 2019-07-21 ENCOUNTER — Other Ambulatory Visit: Payer: Self-pay

## 2019-07-21 DIAGNOSIS — U071 COVID-19: Secondary | ICD-10-CM | POA: Diagnosis not present

## 2019-07-21 IMAGING — DX DG CHEST 2V
2 series · 2 of 2 positions shown · non-contrast
Comparison: 11/03/2013

CLINICAL DATA: Cough, fever, and chills for several days.

EXAM:
CHEST - 2 VIEW

[chest pa]
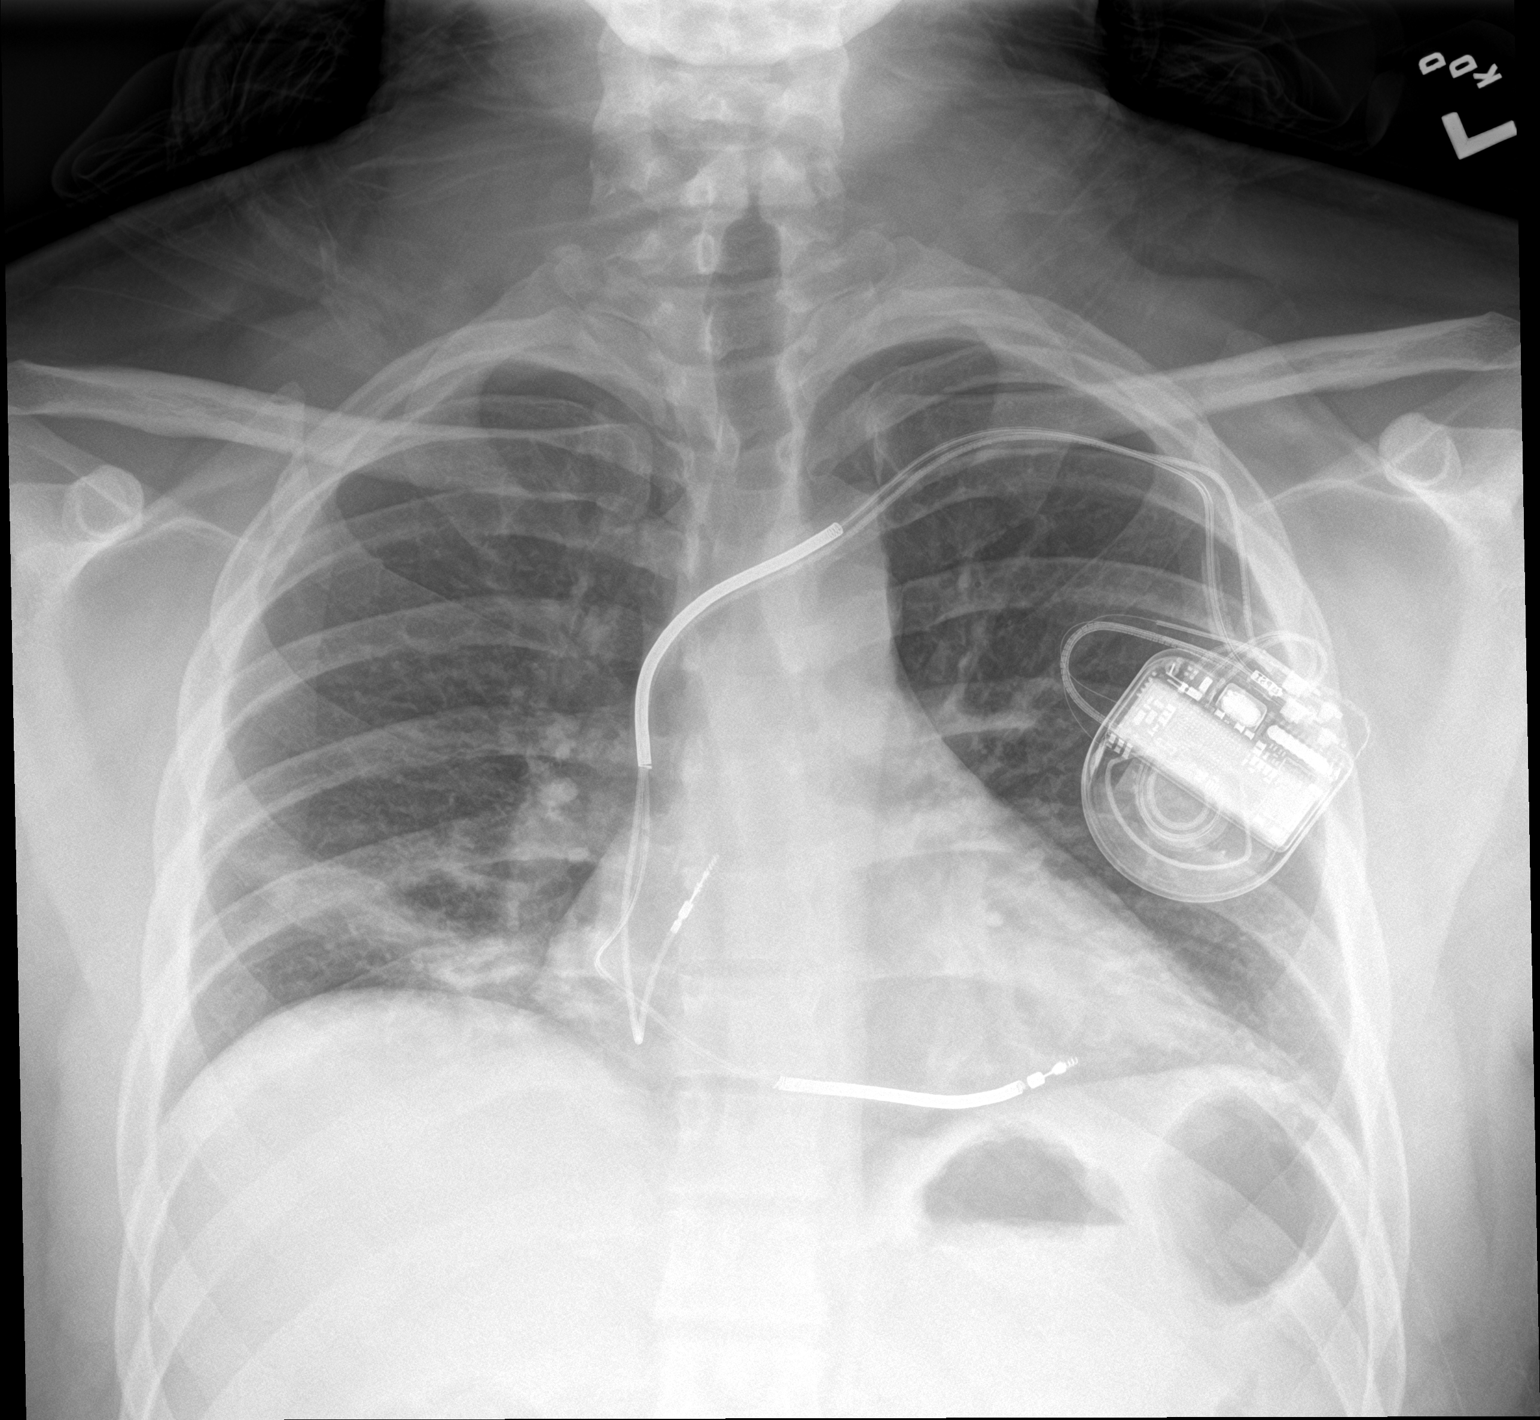

[chest lat]
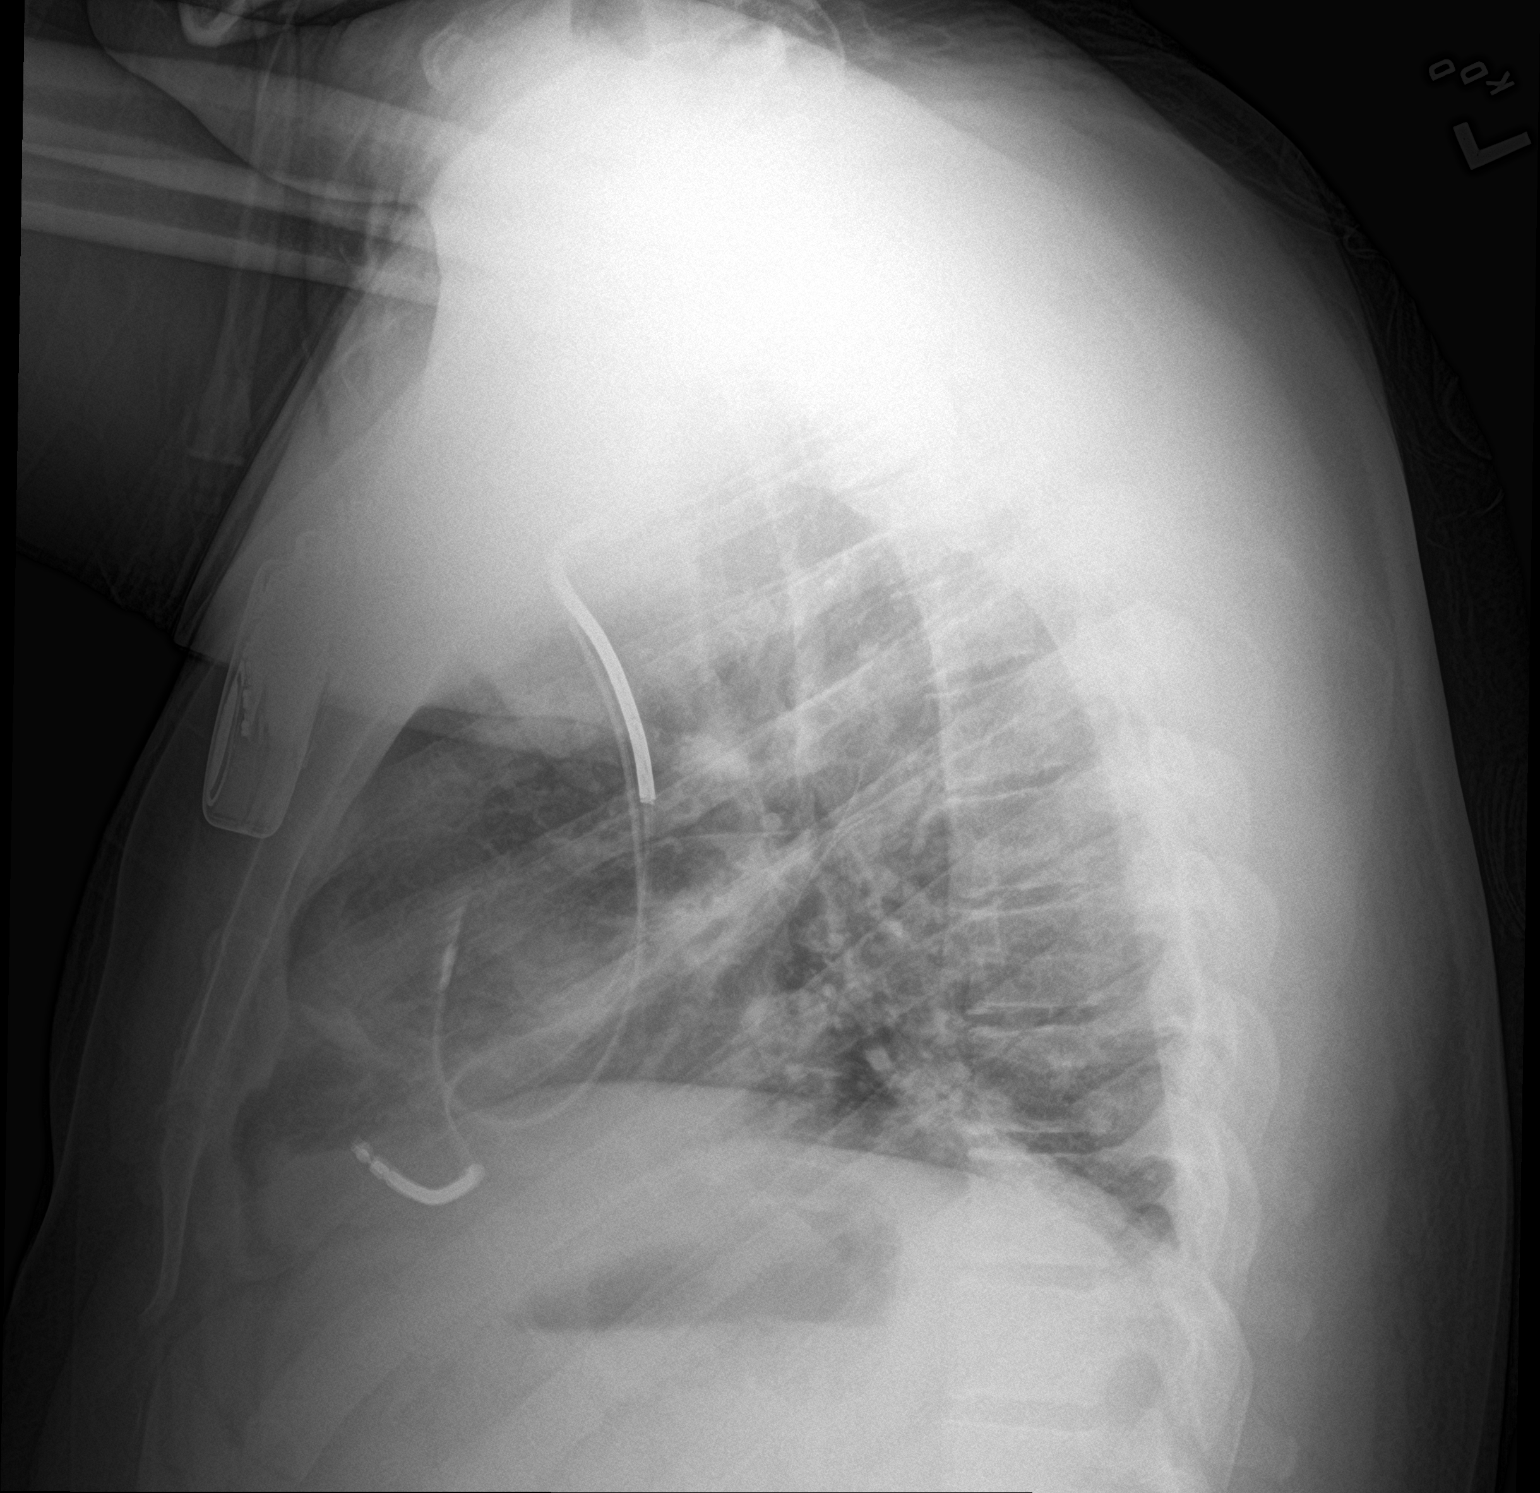

[2 of 2 positions shown; findings below may reference images not displayed]

FINDINGS: Heart size is normal. Dual lead transvenous pacemaker remains in
appropriate position.

Bilateral lower lobe infiltrates are seen, right side greater than
left. No evidence of pleural effusion.
IMPRESSION: Bilateral lower lobe infiltrates, suspicious for pneumonia.

## 2019-07-21 NOTE — Progress Notes (Signed)
   Subjective:    Patient ID: Roger Parsons, male    DOB: [birth year], 28 y.o.   MRN: [id number]  HPI  Patient calls to discuss recent positive Covid results. Patient states he had a slight headache and lost taste and smell, but he is overall feeling fine.  Virtual Visit via Video Note  I connected with Randel Pigg on 07/21/19 at 11:00 AM EST by a video enabled telemedicine application and verified that I am speaking with the correct person using two identifiers.  Location: Patient: home Provider: office   I discussed the limitations of evaluation and management by telemedicine and the availability of in person appointments. The patient expressed understanding and agreed to proceed.  History of Present Illness:    Observations/Objective:   Assessment and Plan:   Follow Up Instructions:    I discussed the assessment and treatment plan with the patient. The patient was provided an opportunity to ask questions and all were answered. The patient agreed with the plan and demonstrated an understanding of the instructions.   The patient was advised to call back or seek an in-person evaluation if the symptoms worsen or if the condition fails to improve as anticipated.  I provided 18 minutes of non-face-to-face time during this encounter.  [facility]  Tested pos  Had some fever  Noted dim taste  Some diffuse   Started tue  Had throat discomfort  May have been exosed  Review of Systems No headache, no major weight loss or weight gain, no chest pain no back pain abdominal pain no change in bowel habits complete ROS otherwise negative     Objective:   Physical Exam  virtual      Assessment & Plan:  Impression known COVID-19 infection.  Day 7.  Quarantine discussed.  Warning signs discussed.  Thus far symptoms are improving.  Also had numerous questions

## 2019-10-12 ENCOUNTER — Encounter: Payer: Self-pay | Admitting: Family Medicine

## 2020-05-05 ENCOUNTER — Encounter: Payer: Self-pay | Admitting: Family Medicine

## 2020-05-05 ENCOUNTER — Telehealth: Payer: Self-pay | Admitting: Family Medicine

## 2020-05-05 ENCOUNTER — Other Ambulatory Visit: Payer: Self-pay

## 2020-05-05 ENCOUNTER — Telehealth (INDEPENDENT_AMBULATORY_CARE_PROVIDER_SITE_OTHER): Payer: Self-pay | Admitting: Family Medicine

## 2020-05-05 DIAGNOSIS — J40 Bronchitis, not specified as acute or chronic: Secondary | ICD-10-CM

## 2020-05-05 DIAGNOSIS — I420 Dilated cardiomyopathy: Secondary | ICD-10-CM

## 2020-05-05 MED ORDER — GUAIFENESIN-CODEINE 100-10 MG/5ML PO SYRP: 5.0000 mL | ORAL_SOLUTION | Freq: Three times a day (TID) | ORAL | 0 refills | Status: DC | PRN

## 2020-05-05 MED ORDER — DOXYCYCLINE HYCLATE 100 MG PO TABS: 100.0000 mg | ORAL_TABLET | Freq: Two times a day (BID) | ORAL | 0 refills | Status: DC

## 2020-05-05 NOTE — Progress Notes (Signed)
Patient ID: Roger Parsons, male    DOB: November 30, 1990, 29 y.o.   MRN: [id number]  Virtual Visit via Telephone Note  I connected with Roger Parsons on 05/05/20 at  3:00 PM EDT by telephone and verified that I am speaking with the correct person using two identifiers.  Location: Patient: home Provider: office   I discussed the limitations, risks, security and privacy concerns of performing an evaluation and management service by telephone and the availability of in person appointments. I also discussed with the patient that there may be a patient responsible charge related to this service. The patient expressed understanding and agreed to proceed.   Chief Complaint  Patient presents with  . Cough   Subjective:    HPI  Pt had phone visit. Pt having cough for about 2-3 weeks pt with history of DCM. Pt states it is mostly a dry cough but sometimes pt is coughing up phelgm. No other symptoms. Wife sick also with similar symptoms. Coughing spells every .  occ with phlegm, no color.  Felt headache with lots of coughing. No meds otc.  Had covid vaccine.  occ take cigars. No h/o inhalers.   Medical History Armando has a past medical history of Cardiomyopathy (HCC).   Outpatient Encounter Medications as of 05/05/2020  Medication Sig  . carvedilol (COREG) 12.5 MG tablet Take 12.5 mg by mouth 2 (two) times daily with a meal.  . sotalol (BETAPACE) 120 MG tablet Take 120 mg by mouth 2 (two) times daily.  . [DISCONTINUED] guaiFENesin-codeine 100-10 MG/5ML syrup Take 10 mLs by mouth every 6 (six) hours as needed for cough.  . [DISCONTINUED] omeprazole (PRILOSEC) 20 MG capsule Take 1 capsule (20 mg total) by mouth daily.  Marland Kitchen doxycycline (VIBRA-TABS) 100 MG tablet Take 1 tablet (100 mg total) by mouth 2 (two) times daily.  Marland Kitchen guaiFENesin-codeine (ROBITUSSIN AC) 100-10 MG/5ML syrup Take 5 mLs by mouth 3 (three) times daily as needed for cough.   No facility-administered encounter  medications on file as of 05/05/2020.     Review of Systems  Constitutional: Negative for chills and fever.  HENT: Negative for congestion, ear pain, rhinorrhea, sinus pressure, sinus pain, sneezing and sore throat.   Eyes: Negative for pain, discharge and itching.  Respiratory: Positive for cough. Negative for shortness of breath and wheezing.   Gastrointestinal: Negative for diarrhea, nausea and vomiting.  Skin: Negative for rash.  Neurological: Negative for headaches.     Vitals There were no vitals taken for this visit.  Objective:   Physical Exam   No PE due to phone visit.  Assessment and Plan   1. Bronchitis  2. Dilated cardiomyopathy (HCC)   Likely viral syndrome. Advising symptomatic tx for viral illness. Reviewed usual course of viral illness vs. Sinusitis and antibiotic use. --Tylenol, cough syrup otc and ibuprofen prn. increase fluids and call if not improving in next 2-3 days. Pt given script for doxycycline since coughing going on for 2-3 wks and h/o cardiomyopathy.  Gave cheratussin. Pt to call or rto if not improving.    Pt in agreement.   F/u prn.   Follow Up Instructions:    I discussed the assessment and treatment plan with the patient. The patient was provided an opportunity to ask questions and all were answered. The patient agreed with the plan and demonstrated an understanding of the instructions.   The patient was advised to call back or seek an in-person evaluation if the symptoms worsen or  if the condition fails to improve as anticipated.  I provided 12 minutes of non-face-to-face time during this encounter.

## 2020-05-05 NOTE — Telephone Encounter (Signed)
Roger Parsons, Roger Parsons are scheduled for a virtual visit with your provider today.    Just as we do with appointments in the office, we must obtain your consent to participate.  Your consent will be active for this visit and any virtual visit you may have with one of our providers in the next 365 days.    If you have a MyChart account, I can also send a copy of this consent to you electronically.  All virtual visits are billed to your insurance company just like a traditional visit in the office.  As this is a virtual visit, video technology does not allow for your provider to perform a traditional examination.  This may limit your provider's ability to fully assess your condition.  If your provider identifies any concerns that need to be evaluated in person or the need to arrange testing such as labs, EKG, etc, we will make arrangements to do so.    Although advances in technology are sophisticated, we cannot ensure that it will always work on either your end or our end.  If the connection with a video visit is poor, we may have to switch to a telephone visit.  With either a video or telephone visit, we are not always able to ensure that we have a secure connection.   I need to obtain your verbal consent now.   Are you willing to proceed with your visit today?   Roger Parsons has provided verbal consent on 05/05/2020 for a virtual visit (video or telephone).   Marlowe Shores, LPN 6/60/6301  6:01 PM

## 2020-07-06 ENCOUNTER — Encounter: Payer: Self-pay | Admitting: Family Medicine

## 2020-07-06 ENCOUNTER — Ambulatory Visit (INDEPENDENT_AMBULATORY_CARE_PROVIDER_SITE_OTHER): Payer: Self-pay | Admitting: Family Medicine

## 2020-07-06 ENCOUNTER — Ambulatory Visit (HOSPITAL_COMMUNITY)
Admission: RE | Admit: 2020-07-06 | Discharge: 2020-07-06 | Disposition: A | Discharge status: Home / Self Care | Payer: Self-pay | Source: Ambulatory Visit | Attending: Family Medicine | Admitting: Family Medicine

## 2020-07-06 ENCOUNTER — Other Ambulatory Visit: Payer: Self-pay

## 2020-07-06 VITALS — BP 114/72 | HR 85 | Temp 98.1°F | Ht 64.0 in | Wt 216.4 lb

## 2020-07-06 DIAGNOSIS — M722 Plantar fascial fibromatosis: Secondary | ICD-10-CM | POA: Insufficient documentation

## 2020-07-06 MED ORDER — DICLOFENAC SODIUM 75 MG PO TBEC: 75.0000 mg | DELAYED_RELEASE_TABLET | Freq: Two times a day (BID) | ORAL | 0 refills | Status: DC

## 2020-07-06 NOTE — Patient Instructions (Signed)
Plantar Fasciitis  Plantar fasciitis is a painful foot condition that affects the heel. It occurs when the band of tissue that connects the toes to the heel bone (plantar fascia) becomes irritated. This can happen as the result of exercising too much or doing other repetitive activities (overuse injury). The pain from plantar fasciitis can range from mild irritation to severe pain that makes it difficult to walk or move. The pain is usually worse in the morning after sleeping, or after sitting or lying down for a while. Pain may also be worse after long periods of walking or standing. What are the causes? This condition may be caused by:  Standing for long periods of time.  Wearing shoes that do not have good arch support.  Doing activities that put stress on joints (high-impact activities), including running, aerobics, and ballet.  Being overweight.  An abnormal way of walking (gait).  Tight muscles in the back of your lower leg (calf).  High arches in your feet.  Starting a new athletic activity. What are the signs or symptoms? The main symptom of this condition is heel pain. Pain may:  Be worse with first steps after a time of rest, especially in the morning after sleeping or after you have been sitting or lying down for a while.  Be worse after long periods of standing still.  Decrease after 30-45 minutes of activity, such as gentle walking. How is this diagnosed? This condition may be diagnosed based on your medical history and your symptoms. Your health care provider may ask questions about your activity level. Your health care provider will do a physical exam to check for:  A tender area on the bottom of your foot.  A high arch in your foot.  Pain when you move your foot.  Difficulty moving your foot. You may have imaging tests to confirm the diagnosis, such as:  X-rays.  Ultrasound.  MRI. How is this treated? Treatment for plantar fasciitis depends on how  severe your condition is. Treatment may include:  Rest, ice, applying pressure (compression), and raising the affected foot (elevation). This may be called RICE therapy. Your health care provider may recommend RICE therapy along with over-the-counter pain medicines to manage your pain.  Exercises to stretch your calves and your plantar fascia.  A splint that holds your foot in a stretched, upward position while you sleep (night splint).  Physical therapy to relieve symptoms and prevent problems in the future.  Injections of steroid medicine (cortisone) to relieve pain and inflammation.  Stimulating your plantar fascia with electrical impulses (extracorporeal shock wave therapy). This is usually the last treatment option before surgery.  Surgery, if other treatments have not worked after 12 months. Follow these instructions at home:  Managing pain, stiffness, and swelling  If directed, put ice on the painful area: ? Put ice in a plastic bag, or use a frozen bottle of water. ? Place a towel between your skin and the bag or bottle. ? Roll the bottom of your foot over the bag or bottle. ? Do this for 20 minutes, 2-3 times a day.  Wear athletic shoes that have air-sole or gel-sole cushions, or try wearing soft shoe inserts that are designed for plantar fasciitis.  Raise (elevate) your foot above the level of your heart while you are sitting or lying down. Activity  Avoid activities that cause pain. Ask your health care provider what activities are safe for you.  Do physical therapy exercises and stretches as told   by your health care provider.  Try activities and forms of exercise that are easier on your joints (low-impact). Examples include swimming, water aerobics, and biking. General instructions  Take over-the-counter and prescription medicines only as told by your health care provider.  Wear a night splint while sleeping, if told by your health care provider. Loosen the splint  if your toes tingle, become numb, or turn cold and blue.  Maintain a healthy weight, or work with your health care provider to lose weight as needed.  Keep all follow-up visits as told by your health care provider. This is important. Contact a health care provider if you:  Have symptoms that do not go away after caring for yourself at home.  Have pain that gets worse.  Have pain that affects your ability to move or do your daily activities. Summary  Plantar fasciitis is a painful foot condition that affects the heel. It occurs when the band of tissue that connects the toes to the heel bone (plantar fascia) becomes irritated.  The main symptom of this condition is heel pain that may be worse after exercising too much or standing still for a long time.  Treatment varies, but it usually starts with rest, ice, compression, and elevation (RICE therapy) and over-the-counter medicines to manage pain. This information is not intended to replace advice given to you by your health care provider. Make sure you discuss any questions you have with your health care provider. Document Revised: 08/09/2017 Document Reviewed: 06/24/2017 Elsevier Patient Education  2020 Elsevier Inc.  

## 2020-07-06 NOTE — Progress Notes (Signed)
Pt having left heel pain. Pt states no known injuries. Going on for about 3 days. No treatments tried at this time.     Patient ID: Roger Parsons, male    DOB: [birth year], 29 y.o.   MRN: [id number]   Chief Complaint  Patient presents with  . Foot Pain   Subjective:  CC: sudden onset left heel pain  Presents today with sudden onset left heel pain.  Reports he was just walking along and suddenly felt a sharp pain in his left heel, this occurred 3 days ago.  He was wearing running shoes at that time, recalls no injury, pain increases when he spreads his toes, describes the pain is constant throb, and occasionally shoots towards his Achilles tendon.  Denies any recent antibiotic use, he is only on some cough syrup.  He has recently changed jobs and is on his feet all day, wonders if this is related.    Medical History Roger Parsons has a past medical history of Cardiomyopathy (HCC).   Outpatient Encounter Medications as of 07/06/2020  Medication Sig  . carvedilol (COREG) 12.5 MG tablet Take 12.5 mg by mouth 2 (two) times daily with a meal.  . sotalol (BETAPACE) 120 MG tablet Take 120 mg by mouth 2 (two) times daily.  . [DISCONTINUED] doxycycline (VIBRA-TABS) 100 MG tablet Take 1 tablet (100 mg total) by mouth 2 (two) times daily.  . [DISCONTINUED] guaiFENesin-codeine (ROBITUSSIN AC) 100-10 MG/5ML syrup Take 5 mLs by mouth 3 (three) times daily as needed for cough.  . diclofenac (VOLTAREN) 75 MG EC tablet Take 1 tablet (75 mg total) by mouth 2 (two) times daily.   No facility-administered encounter medications on file as of 07/06/2020.     Review of Systems  Constitutional: Negative.   Respiratory: Negative.   Cardiovascular: Negative.   Gastrointestinal: Negative.   Musculoskeletal:       Sudden onset left heel pain three days ago.      Vitals BP 114/72   Pulse 85   Temp 98.1 F (36.7 C)   Ht 5\' 4"  (1.626 m)   Wt 216 lb 6.4 oz (98.2 kg)   SpO2 97%   BMI 37.14 kg/m   Objective:     Physical Exam Vitals and nursing note reviewed.  Constitutional:      Appearance: Normal appearance.  Cardiovascular:     Rate and Rhythm: Normal rate and regular rhythm.     Heart sounds: Normal heart sounds.  Pulmonary:     Effort: Pulmonary effort is normal.     Breath sounds: Normal breath sounds.  Musculoskeletal:        General: Tenderness present. No swelling, deformity or signs of injury.     Comments: Tenderness noted on heel that radiates towards heel. Limping, guarding with walking. No bruising, no swelling.   Neurological:     Mental Status: He is alert and oriented to person, place, and time.  Psychiatric:        Mood and Affect: Mood normal.        Behavior: Behavior normal.        Thought Content: Thought content normal.        Judgment: Judgment normal.      Assessment and Plan   1. Plantar fasciitis of left foot - DG Foot Complete Left - diclofenac (VOLTAREN) 75 MG EC tablet; Take 1 tablet (75 mg total) by mouth 2 (two) times daily.  Dispense: 30 tablet; Refill: 0   Will rule out fracture of  the left foot, he will go to [facility] for x-ray now.  With the assumption that the x-ray will be negative, he will utilize ice, and anti-inflammatory medications for the discomfort.  If the x-ray is positive, will notify of next steps.  Informed her there are 2 orthopedic urgent cares in [place] close to his place of employment.  This is a new job for him, and he is hopeful that he will not have to miss work.  Update: Radiology results, no fracture in the foot, heel, incidental finding of heel spur.  Unlikely to be causing pain however the new job could be what is aggravating his foot.  We will continue with a plan of conservative treatment ice, and anti-inflammatory.  If he is still having issues in the next 2 to 3 weeks, we will consider referral to podiatry, for possible injection.  The nurse informed him of this information.  Agrees with plan of care discussed  today. Understands warning signs to seek further care: Increasing pain, increasing difficulty walking. Understands to follow-up will be decided upon x-ray results.  Information provided on x-ray today.   Novella Olive, NP 07/06/2020

## 2021-10-06 ENCOUNTER — Other Ambulatory Visit: Payer: Self-pay

## 2021-10-06 ENCOUNTER — Encounter: Payer: Self-pay | Admitting: Family Medicine

## 2021-10-06 ENCOUNTER — Ambulatory Visit (INDEPENDENT_AMBULATORY_CARE_PROVIDER_SITE_OTHER): Payer: Self-pay | Admitting: Family Medicine

## 2021-10-06 VITALS — BP 122/80 | HR 74 | Temp 98.2°F | Ht 64.0 in | Wt 197.6 lb

## 2021-10-06 DIAGNOSIS — I4719 Other supraventricular tachycardia: Secondary | ICD-10-CM | POA: Insufficient documentation

## 2021-10-06 DIAGNOSIS — Z111 Encounter for screening for respiratory tuberculosis: Secondary | ICD-10-CM

## 2021-10-06 DIAGNOSIS — I4891 Unspecified atrial fibrillation: Secondary | ICD-10-CM | POA: Insufficient documentation

## 2021-10-06 DIAGNOSIS — Z0000 Encounter for general adult medical examination without abnormal findings: Secondary | ICD-10-CM

## 2021-10-06 DIAGNOSIS — I471 Supraventricular tachycardia: Secondary | ICD-10-CM | POA: Insufficient documentation

## 2021-10-06 NOTE — Assessment & Plan Note (Signed)
Advised to get vaccine records (Nurse recommended his HS). TB test today. Return on Monday for ready.  Will fill out form after return. Doing well from cardiac standpoint. Follows with [facility].

## 2021-10-06 NOTE — Progress Notes (Signed)
° °  Subjective:  Patient ID: Roger Parsons, male    DOB: June 16, 1991  Age: 31 y.o. MRN: [id number]  CC: Physical  HPI Roger Parsons is a 31 y.o. 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 Needs TB test. Unsure of his immunzation history. [facility] shows COVID vaccinations but no other vaccines.  He is feel well. No chest pain or syncope. Plays pick up basketball without difficulty. Compliant with Coreg and Sotalol.    History reviewed and updated as below.  Past Medical History:  Diagnosis Date   Cardiomyopathy [facility])    Past Surgical History:  Procedure Laterality Date   CARDIAC CATHETERIZATION     CARDIAC DEFIBRILLATOR PLACEMENT     MYRINGOTOMY     Social History   Tobacco Use   Smoking status: Never   Smokeless tobacco: Never  Substance Use Topics   Alcohol use: No    Review of Systems Per HPI  Objective:   Today's Vitals: BP 122/80    Pulse 74    Temp 98.2 F (36.8 C)    Ht 5\' 4"  (1.626 m)    Wt 197 lb 9.6 oz (89.6 kg)    SpO2 96%    BMI 33.92 kg/m   Physical Exam Constitutional:      General: He is not in acute distress.    Appearance: Normal appearance. He is not ill-appearing.  HENT:     Head: Normocephalic and atraumatic.  Eyes:     General:        Right eye: No discharge.        Left eye: No discharge.     Conjunctiva/sclera: Conjunctivae normal.  Cardiovascular:     Rate and Rhythm: Normal rate and regular rhythm.  Pulmonary:     Effort: Pulmonary effort is normal.     Breath sounds: Normal breath sounds. No wheezing or rales.  Neurological:     Mental Status: He is alert.  Psychiatric:        Mood and Affect: Mood normal.        Behavior: Behavior normal.     Assessment & Plan:   Problem List Items Addressed This Visit       Other    Annual physical exam - Primary    Advised to get vaccine records (Nurse recommended his HS). TB test today. Return on Monday for ready.  Will fill out form after return. Doing well from cardiac standpoint. Follows with [facility].      Other Visit Diagnoses     Screening for tuberculosis       Relevant Orders   PPD (Completed)      Thersa Salt DO [place]

## 2021-10-06 NOTE — Patient Instructions (Signed)
Return on Monday.   Bring immunization records.  Follow up annually.  Take care  Dr. Adriana Simas

## 2021-10-09 ENCOUNTER — Other Ambulatory Visit: Payer: Self-pay

## 2021-10-09 LAB — TB SKIN TEST
Induration: 0 mm
TB Skin Test: NEGATIVE

## 2022-12-24 ENCOUNTER — Other Ambulatory Visit: Payer: Self-pay

## 2022-12-24 ENCOUNTER — Emergency Department (HOSPITAL_COMMUNITY)
Admission: EM | Admit: 2022-12-24 | Discharge: 2022-12-24 | Disposition: A | Discharge status: Home / Self Care | Payer: Self-pay | Attending: Emergency Medicine | Admitting: Emergency Medicine

## 2022-12-24 ENCOUNTER — Emergency Department (HOSPITAL_COMMUNITY): Payer: Self-pay

## 2022-12-24 ENCOUNTER — Encounter (HOSPITAL_COMMUNITY): Payer: Self-pay | Admitting: *Deleted

## 2022-12-24 DIAGNOSIS — R0789 Other chest pain: Secondary | ICD-10-CM | POA: Insufficient documentation

## 2022-12-24 LAB — CBC
HCT: 41.4 % (ref 39.0–52.0)
Hemoglobin: 14.3 g/dL (ref 13.0–17.0)
MCH: 29.5 pg (ref 26.0–34.0)
MCHC: 34.5 g/dL (ref 30.0–36.0)
MCV: 85.4 fL (ref 80.0–100.0)
Platelets: 249 10*3/uL (ref 150–400)
RBC: 4.85 MIL/uL (ref 4.22–5.81)
RDW: 11.9 % (ref 11.5–15.5)
WBC: 12.7 10*3/uL — ABNORMAL HIGH (ref 4.0–10.5)
nRBC: 0 % (ref 0.0–0.2)

## 2022-12-24 LAB — BASIC METABOLIC PANEL
Anion gap: 3 — ABNORMAL LOW (ref 5–15)
BUN: 12 mg/dL (ref 6–20)
CO2: 26 mmol/L (ref 22–32)
Calcium: 8.7 mg/dL — ABNORMAL LOW (ref 8.9–10.3)
Chloride: 106 mmol/L (ref 98–111)
Creatinine, Ser: 1.06 mg/dL (ref 0.61–1.24)
GFR, Estimated: >60 mL/min (ref >60)
Glucose, Bld: 124 mg/dL — ABNORMAL HIGH (ref 70–99)
Potassium: 3.5 mmol/L (ref 3.5–5.1)
Sodium: 135 mmol/L (ref 135–145)

## 2022-12-24 LAB — D-DIMER, QUANTITATIVE: D-Dimer, Quant: 0.31 ug/mL-FEU (ref 0.00–0.50)

## 2022-12-24 LAB — TROPONIN I (HIGH SENSITIVITY)
Troponin I (High Sensitivity): 6 ng/L (ref <18)
Troponin I (High Sensitivity): 6 ng/L (ref <18)

## 2022-12-24 MED ORDER — HYDROCODONE-ACETAMINOPHEN 5-325 MG PO TABS: ORAL_TABLET | ORAL | 0 refills | Status: DC

## 2022-12-24 MED ORDER — HYDROCODONE-ACETAMINOPHEN 5-325 MG PO TABS
1.0000 | ORAL_TABLET | Freq: Once | ORAL | Status: AC
  Administered 2022-12-24: 1 via ORAL
  Filled 2022-12-24: qty 1

## 2022-12-24 NOTE — ED Notes (Signed)
Dc instructions and scripts reviewed with pt. Pt verbalized understanding medication can cause drowsiness. No questions or concerns at this time. Will follow up with cardiology. Pt wheeled out to vehicle and transported home by wife.

## 2022-12-24 NOTE — ED Triage Notes (Signed)
Pt with mid CP that radiates to right neck/shoulder since Saturday. Pt states has an ICD (medtronix) but denies it firing.

## 2022-12-24 NOTE — Discharge Instructions (Signed)
As discussed, please follow-up with your cardiologist or your primary care provider for recheck.  Also, try over-the-counter 4% lidocaine patches applied to the affected area.  Urgency department for any new or worsening symptoms.

## 2022-12-24 NOTE — ED Provider Triage Note (Signed)
Emergency Medicine Provider Triage Evaluation Note  Roger Parsons , a 32 y.o. male  was evaluated in triage.  History history of cardiomyopathy, controlled atrial fibrillation and has ICD.  pt complains of sharp stabbing right-sided chest pain, right neck pain, shoulder pain.  Symptoms began on Saturday.  States he has been sleeping in a different bed at a hotel recently.  No known injury.  Pain worsens with certain movements, deep breathing.  Taking over-the-counter pain relievers without improvement.  Has history of cardiomyopathy and has ICD but denies recent firing of his defibrillator.  Denies any shortness of breath or pain radiating down his arm  Review of Systems  Positive: Chest pain, neck pain, right shoulder pain, pain with breathing Negative: Shortness of breath, fever, chills,  Physical Exam  BP (!) 137/99 (BP Location: Right Arm)   Pulse 87   Temp 99.2 F (37.3 C) (Oral)   Resp 20   Ht 5\' 4"  (1.626 m)   Wt 97.5 kg   SpO2 99%   BMI 36.90 kg/m  Gen:   Awake, no distress   Resp:  Normal effort  MSK:   Moves extremities without difficulty  Other:    Medical Decision Making  Medically screening exam initiated at 1:00 PM.  Appropriate orders placed.  Roger Parsons was informed that the remainder of the evaluation will be completed by another provider, this initial triage assessment does not replace that evaluation, and the importance of remaining in the ED until their evaluation is complete.     Roger Aus, PA-C 12/24/22 1303

## 2022-12-25 ENCOUNTER — Telehealth: Payer: Self-pay

## 2022-12-25 NOTE — Transitions of Care (Post Inpatient/ED Visit) (Unsigned)
   12/25/2022  Name: Roger Parsons MRN: [id number] DOB: 1990/11/28  Today's TOC FU Call Status: Today's TOC FU Call Status:: Unsuccessul Call (1st Attempt) Unsuccessful Call (1st Attempt) Date: 12/25/22  Attempted to reach the patient regarding the most recent Inpatient/ED visit.  Follow Up Plan: Additional outreach attempts will be made to reach the patient to complete the Transitions of Care (Post Inpatient/ED visit) call.   Signature Karena Addison, LPN [facility] Nurse Health Advisor Direct Dial [phone]

## 2022-12-26 NOTE — Transitions of Care (Post Inpatient/ED Visit) (Unsigned)
   12/26/2022  Name: Roger Parsons MRN: [id number] DOB: 1991/04/22  Today's TOC FU Call Status: Today's TOC FU Call Status:: Unsuccessful Call (2nd Attempt) Unsuccessful Call (1st Attempt) Date: 12/25/22 Unsuccessful Call (2nd Attempt) Date: 12/26/22  Attempted to reach the patient regarding the most recent Inpatient/ED visit.  Follow Up Plan: Additional outreach attempts will be made to reach the patient to complete the Transitions of Care (Post Inpatient/ED visit) call.   Signature Karena Addison, LPN [facility] Nurse Health Advisor Direct Dial [phone]

## 2022-12-27 NOTE — Transitions of Care (Post Inpatient/ED Visit) (Signed)
   12/27/2022  Name: [name] MRN: [id number] DOB: 12/11/1990  Today's TOC FU Call Status: Today's TOC FU Call Status:: Unsuccessful Call (3rd Attempt) Unsuccessful Call (1st Attempt) Date: 12/25/22 Unsuccessful Call (2nd Attempt) Date: 12/26/22 Unsuccessful Call (3rd Attempt) Date: 12/27/22  Attempted to reach the patient regarding the most recent Inpatient/ED visit.  Follow Up Plan: No further outreach attempts will be made at this time. We have been unable to contact the patient.  Signature Karena Addison, LPN [facility] Nurse Health Advisor Direct Dial [phone]

## 2022-12-27 NOTE — ED Provider Notes (Signed)
[place] EMERGENCY DEPARTMENT AT [facility] Provider Note   CSN: [id number] Arrival date & time: 12/24/22  1106     History  Chief Complaint  Patient presents with   Chest Pain    Roger Parsons is a 32 y.o. male.   Chest Pain Associated symptoms: no abdominal pain, no back pain, no cough, no fever, no headache, no nausea, no numbness, no palpitations, no shortness of breath, no vomiting and no weakness         Roger Parsons is a 32 y.o. male with past medical history of cardiomyopathy, atrial fibrillation, and implanted ICD who presents to the Emergency Department complaining of right-sided chest pain that radiates to his right shoulder right neck area.  Describes pain as sharp and stabbing in quality.  Symptoms began 2 days ago and began after sleeping in a different bed.  No known injury.  His pain worsens with certain movements and deep breathing.  He has taken over-the-counter pain relievers without improvement.  Denies any known firing of his ICD.  He denies fever, cough, shortness of breath, pain of his jaw or pain radiating down his right or left arms   Home Medications Prior to Admission medications   Medication Sig Start Date End Date Taking? Authorizing Provider  HYDROcodone-acetaminophen (NORCO/VICODIN) 5-325 MG tablet Take one tab po q 4 hrs prn pain 12/24/22  Yes Phillippe Orlick, PA-C  carvedilol (COREG) 12.5 MG tablet Take 12.5 mg by mouth 2 (two) times daily with a meal.    [provider]  sotalol (BETAPACE) 120 MG tablet Take 120 mg by mouth 2 (two) times daily.    [provider]      Allergies    Patient has no known allergies.    Review of Systems   Review of Systems  Constitutional:  Negative for chills and fever.  Respiratory:  Negative for cough, chest tightness and shortness of breath.   Cardiovascular:  Positive for chest pain. Negative for palpitations and leg swelling.  Gastrointestinal:  Negative for abdominal pain,  nausea and vomiting.  Musculoskeletal:  Negative for back pain, neck pain and neck stiffness.  Skin:  Negative for rash.  Neurological:  Negative for weakness, numbness and headaches.    Physical Exam Updated Vital Signs BP 134/82   Pulse 69   Temp 98.9 F (37.2 C) (Oral)   Resp (!) 21   Ht 5\' 4"  (1.626 m)   Wt 97.5 kg   SpO2 99%   BMI 36.90 kg/m  Physical Exam Vitals and nursing note reviewed.  Constitutional:      General: He is not in acute distress.    Appearance: Normal appearance. He is not ill-appearing or toxic-appearing.  HENT:     Mouth/Throat:     Mouth: Mucous membranes are moist.  Cardiovascular:     Rate and Rhythm: Normal rate and regular rhythm.     Pulses: Normal pulses.  Pulmonary:     Effort: Pulmonary effort is normal.     Breath sounds: Normal breath sounds.     Comments: Some tenderness to palpation of the uppermost right chest wall.  No bony or soft tissue crepitus noted. Chest:     Chest wall: Tenderness present.  Abdominal:     Palpations: Abdomen is soft.     Tenderness: There is no abdominal tenderness.  Musculoskeletal:        General: Normal range of motion.     Cervical back: Normal range of motion.  No tenderness.     Right lower leg: No edema.     Left lower leg: No edema.  Skin:    General: Skin is warm.     Capillary Refill: Capillary refill takes less than 2 seconds.     Findings: No rash.  Neurological:     General: No focal deficit present.     Mental Status: He is alert.     Sensory: No sensory deficit.     Motor: No weakness.     ED Results / Procedures / Treatments   Labs (all labs ordered are listed, but only abnormal results are displayed) Labs Reviewed  BASIC METABOLIC PANEL - Abnormal; Notable for the following components:      Result Value   Glucose, Bld 124 (*)    Calcium 8.7 (*)    Anion gap 3 (*)    All other components within normal limits  CBC - Abnormal; Notable for the following components:   WBC 12.7  (*)    All other components within normal limits  D-DIMER, QUANTITATIVE  TROPONIN I (HIGH SENSITIVITY)  TROPONIN I (HIGH SENSITIVITY)    EKG EKG Interpretation  Date/Time:  Monday December 24 2022 11:17:16 EDT Ventricular Rate:  86 PR Interval:  164 QRS Duration: 102 QT Interval:  330 QTC Calculation: 394 R Axis:   85 Text Interpretation: Normal sinus rhythm Incomplete right bundle branch block Nonspecific T wave abnormality Abnormal ECG When compared with ECG of 03-Nov-2013 17:35, Incomplete right bundle branch block is now Present Confirmed by Meridee Score [phone]) on 12/24/2022 2:53:42 PM  Radiology DG Chest 1 View  Result Date: 12/24/2022 CLINICAL DATA:  Chest pain EXAM: CHEST  1 VIEW COMPARISON:  CXR 03/30/18 FINDINGS: Left-sided dual lead pacemaker in place with unchanged lead positioning. No pleural effusion. No pneumothorax. Cardiac and mediastinal contours are unchanged. No focal airspace opacity. No radiographically apparent displaced rib fractures. Visualized upper abdomen is unremarkable. IMPRESSION: No radiographic finding to explain chest pain. Electronically Signed   By: Lorenza Cambridge M.D.   On: 12/24/2022 11:38     Procedures Procedures    Medications Ordered in ED Medications  HYDROcodone-acetaminophen (NORCO/VICODIN) 5-325 MG per tablet 1 tablet (1 tablet Oral Given 12/24/22 1554)    ED Course/ Medical Decision Making/ A&P                             Medical Decision Making Patient with known history of cardiomyopathy and atrial fibrillation here with right-sided chest pain.  He also has an ICD but denies any known firing of his defibrillator.  Right-sided chest pain is reproducible with movement and with palpation.  Denies any shortness of breath.  Differential would include but not limited to musculoskeletal pain, PE, ACS, dissection,  pneumonia  Amount and/or Complexity of Data Reviewed Labs: ordered.    Details: Labs interpreted by me, no significant  leukocytosis, chemistries without significant derangement.  D-dimer unremarkable.  Delta troponin remains flat Radiology: ordered.    Details: Chest x-ray without acute cardiopulmonary process ECG/medicine tests: ordered.    Details: EKG shows sinus rhythm with incomplete right bundle branch block and nonspecific T wave abnormality Discussion of management or test interpretation with external provider(s): Patient here with likely musculoskeletal chest pain.  Endorses sleeping in a different bed recently as he was staying in a hotel.  I feel that he is appropriate for discharge home although I have recommended close outpatient follow-up with his  cardiologist.  Short course of pain medication prescribed as he denies relief from over-the-counter medications, database reviewed. he is agreeable to plan.  Strict return precautions were also given  Risk Prescription drug management.           Final Clinical Impression(s) / ED Diagnoses Final diagnoses:  Chest wall pain    Rx / DC Orders ED Discharge Orders          Ordered    HYDROcodone-acetaminophen (NORCO/VICODIN) 5-325 MG tablet        12/24/22 1541              Pauline Aus, PA-C 12/27/22 1419    Terrilee Files, MD 12/28/22 [phone]

## 2022-12-28 DIAGNOSIS — A419 Sepsis, unspecified organism: Secondary | ICD-10-CM | POA: Insufficient documentation

## 2023-01-01 DIAGNOSIS — M869 Osteomyelitis, unspecified: Secondary | ICD-10-CM | POA: Insufficient documentation

## 2023-01-04 ENCOUNTER — Telehealth (INDEPENDENT_AMBULATORY_CARE_PROVIDER_SITE_OTHER): Payer: Self-pay

## 2023-01-04 ENCOUNTER — Telehealth: Payer: Self-pay

## 2023-01-04 NOTE — Transitions of Care (Post Inpatient/ED Visit) (Signed)
   01/04/2023  Name: Roger Parsons MRN: [id number] DOB: [birth year]  Today's TOC FU Call Status: Today's TOC FU Call Status:: Successful TOC FU Call Competed TOC FU Call Complete Date: 01/04/23  Transition Care Management Follow-up Telephone Call Date of Discharge: 01/03/23 Discharge Facility: Other ([facility]) Name of Other ([facility]) Discharge Facility: [facility] Type of Discharge: Inpatient Admission Primary Inpatient Discharge Diagnosis:: osteomyelitis How have you been since you were released from the hospital?: Better Any questions or concerns?: No  Items Reviewed: Did you receive and understand the discharge instructions provided?: Yes Medications obtained and verified?: Yes (Medications Reviewed) Any new allergies since your discharge?: No Dietary orders reviewed?: Yes Do you have support at home?: Yes People in Home: spouse  Home Care and Equipment/Supplies: Were Home Health Services Ordered?: Yes Name of Home Health Agency:: unknown Has Agency set up a time to come to your home?: No Any new equipment or medical supplies ordered?: Yes Name of Medical supply agency?: unknown Were you able to get the equipment/medical supplies?: Yes Do you have any questions related to the use of the equipment/supplies?: No  Functional Questionnaire: Do you need assistance with bathing/showering or dressing?: No Do you need assistance with meal preparation?: No Do you need assistance with eating?: No Do you have difficulty maintaining continence: No Do you need assistance with getting out of bed/getting out of a chair/moving?: No Do you have difficulty managing or taking your medications?: No  Follow up appointments reviewed: PCP Follow-up appointment confirmed?: NA Specialist Hospital Follow-up appointment confirmed?: Yes Date of Specialist follow-up appointment?: 01/09/23 Follow-Up Specialty Provider:: [facility] Do you need transportation to your follow-up appointment?: No Do you  understand care options if your condition(s) worsen?: Yes-patient verbalized understanding    SIGNATURE Karena Addison, LPN [facility] Nurse Health Advisor Direct Dial [phone]

## 2023-01-10 NOTE — Transitions of Care (Post Inpatient/ED Visit) (Signed)
   01/10/2023  Name: Roger Parsons MRN: [id number] DOB: 07/27/1991  Today's TOC FU Call Status:    Attempted to reach the patient regarding the most recent Inpatient/ED visit.  Follow Up Plan: No further outreach attempts will be made at this time. We have been unable to contact the patient.  Signature Karena Addison, LPN [facility] Nurse Health Advisor Direct Dial [phone]

## 2023-01-22 ENCOUNTER — Ambulatory Visit (INDEPENDENT_AMBULATORY_CARE_PROVIDER_SITE_OTHER): Payer: Self-pay | Admitting: Family Medicine

## 2023-01-22 VITALS — BP 110/72 | HR 68 | Temp 98.2°F | Ht 64.0 in | Wt 201.0 lb

## 2023-01-22 DIAGNOSIS — M869 Osteomyelitis, unspecified: Secondary | ICD-10-CM

## 2023-01-22 DIAGNOSIS — A400 Sepsis due to streptococcus, group A: Secondary | ICD-10-CM

## 2023-01-22 DIAGNOSIS — J029 Acute pharyngitis, unspecified: Secondary | ICD-10-CM | POA: Insufficient documentation

## 2023-01-22 DIAGNOSIS — T82898A Other specified complication of vascular prosthetic devices, implants and grafts, initial encounter: Secondary | ICD-10-CM | POA: Insufficient documentation

## 2023-01-22 NOTE — Patient Instructions (Signed)
Follow up in 6 weeks.   No lifting at this time.  Call with concerns.  Take care  Dr. Adriana Simas

## 2023-01-23 ENCOUNTER — Other Ambulatory Visit: Payer: Self-pay

## 2023-01-23 ENCOUNTER — Emergency Department (HOSPITAL_COMMUNITY): Payer: Self-pay

## 2023-01-23 ENCOUNTER — Emergency Department (HOSPITAL_COMMUNITY)
Admission: EM | Admit: 2023-01-23 | Discharge: 2023-01-23 | Disposition: A | Discharge status: Home / Self Care | Payer: Self-pay | Attending: Emergency Medicine | Admitting: Emergency Medicine

## 2023-01-23 DIAGNOSIS — T82898A Other specified complication of vascular prosthetic devices, implants and grafts, initial encounter: Secondary | ICD-10-CM

## 2023-01-23 MED ORDER — ALTEPLASE 2 MG IJ SOLR
2.0000 mg | Freq: Once | INTRAMUSCULAR | Status: AC
  Administered 2023-01-23: 2 mg
  Filled 2023-01-23 (×2): qty 2

## 2023-01-23 MED ORDER — STERILE WATER FOR INJECTION IJ SOLN
INTRAMUSCULAR | Status: AC
  Administered 2023-01-23: 2.2 mL
  Filled 2023-01-23: qty 10

## 2023-01-23 MED ORDER — ALTEPLASE 100 MG IV SOLR: 2.0000 mg | Freq: Once | INTRAVENOUS | Status: DC

## 2023-01-23 MED ORDER — SODIUM CHLORIDE 0.9 % IV BOLUS: 1000.0000 mL | Freq: Once | INTRAVENOUS | Status: DC

## 2023-01-23 NOTE — ED Provider Notes (Signed)
[facility] Emergency Department Provider Note MRN:  [id number]  Arrival date & time: 01/23/23     Chief Complaint   PICC line malfunction History of Present Illness   Roger Parsons is a 32 y.o. year-old male with a history of cardiomyopathy presenting to the ED with chief complaint of PICC line malfunction.  Patient has a PICC line in place due to a bloodstream infection that occurred after strep throat.  His care was done at [facility].  Today his PICC line dressing was changed and flushed by nursing everything went well.  This evening he was attempting to run his antibiotics and they would not go in.  Here for evaluation.  No other complaints.  Review of Systems  A thorough review of systems was obtained and all systems are negative except as noted in the HPI and PMH.   Patient's Health History    Past Medical History:  Diagnosis Date   Cardiomyopathy [facility])     Past Surgical History:  Procedure Laterality Date   CARDIAC CATHETERIZATION     CARDIAC DEFIBRILLATOR PLACEMENT     MYRINGOTOMY      No family history on file.  Social History   Socioeconomic History   Marital status: Single    Spouse name: Not on file   Number of children: Not on file   Years of education: Not on file   Highest education level: Not on file  Occupational History   Not on file  Tobacco Use   Smoking status: Never   Smokeless tobacco: Never  Vaping Use   Vaping Use: Never used  Substance and Sexual Activity   Alcohol use: No   Drug use: No   Sexual activity: Not on file  Other Topics Concern   Not on file  Social History Narrative   Not on file   Social Determinants of Health   Financial Resource Strain: Not on file  Food Insecurity: Not on file  Transportation Needs: Not on file  Physical Activity: Not on file  Stress: Not on file  Social Connections: Not on file  Intimate Partner Violence: Not on file     Physical Exam   Vitals:   01/23/23 0017  BP:  132/84  Pulse: 84  Resp: 17  Temp: 98.1 F (36.7 C)  SpO2: 99%    CONSTITUTIONAL: Well-appearing, NAD NEURO/PSYCH:  Alert and oriented x 3, no focal deficits EYES:  eyes equal and reactive ENT/NECK:  no LAD, no JVD CARDIO: Regular rate, well-perfused, normal S1 and S2 PULM:  CTAB no wheezing or rhonchi GI/GU:  non-distended, non-tender MSK/SPINE:  No gross deformities, no edema SKIN:  no rash, atraumatic   *Additional and/or pertinent findings included in MDM below  Diagnostic and Interventional Summary    EKG Interpretation  Date/Time:    Ventricular Rate:    PR Interval:    QRS Duration:   QT Interval:    QTC Calculation:   R Axis:     Text Interpretation:         Labs Reviewed - No data to display  No orders to display    Medications  sodium chloride 0.9 % bolus 1,000 mL (has no administration in time range)  sterile water (preservative free) injection (has no administration in time range)  alteplase (CATHFLO ACTIVASE) injection 2 mg (2 mg Intracatheter Given by Other 01/23/23 0221)     Procedures  /  Critical Care Procedures  ED Course and Medical Decision Making  Initial Impression  and Ddx Patient is well-appearing with no complaints physically, normal vital signs, the PICC line site looks very normal, nontender.  No swelling to the arm.  Possibly a semioccluded PICC line given that we are able to flush the line but not very quickly.  Will attempt alteplase dwell and reassess.  Past medical/surgical history that increases complexity of ED encounter: Cardiomyopathy  Interpretation of Diagnostics Laboratory and/or imaging options to aid in the diagnosis/care of the patient were considered.  After careful history and physical examination, it was determined that there was no indication for diagnostics at this time.  Patient Reassessment and Ultimate Disposition/Management     Patient declines chest x-ray to confirm PICC line positioning, overall very low  concern that the PICC line is malpositioned.  After the alteplase dwell the PICC line seems to be functioning normally, appropriate for discharge.  Patient management required discussion with the following services or consulting groups:  None  Complexity of Problems Addressed Acute illness or injury that poses threat of life of bodily function  Additional Data Reviewed and Analyzed Further history obtained from: Care Everywhere  Additional Factors Impacting ED Encounter Risk Consideration of hospitalization  Elmer Sow. Pilar Plate, MD [facility] Emergency Medicine [facility] [email or web address]  Final Clinical Impressions(s) / ED Diagnoses     ICD-10-CM   1. Occluded PICC line, initial encounter [facility])  [phone]       ED Discharge Orders     None        Discharge Instructions Discussed with and Provided to Patient:     Discharge Instructions      You were evaluated in the Emergency Department and after careful evaluation, we did not find any emergent condition requiring admission or further testing in the hospital.  Your exam/testing today was overall reassuring.  We used medications to unclog your PICC line.  Please return to the Emergency Department if you experience any worsening of your condition.  Thank you for allowing [place] to be a part of your care.        Sabas Sous, MD 01/23/23 [phone]

## 2023-01-23 NOTE — ED Notes (Signed)
This RN spoke with vascular team to verify administration instructions for Alteplase. Vascular said the order should read "Cathflo" and medication is to sit in picc line for 30 minutes, then aspirate. Dr. Pilar Plate made aware. Medication to be administered by Dr. Pilar Plate.

## 2023-01-23 NOTE — ED Triage Notes (Signed)
Pt c/o of PICC line not working, states his home health nurse came out today to change his dressing but when he tried to start his antibiotics tonight it would not flush.

## 2023-01-23 NOTE — Discharge Instructions (Signed)
You were evaluated in the Emergency Department and after careful evaluation, we did not find any emergent condition requiring admission or further testing in the hospital.  Your exam/testing today was overall reassuring.  We used medications to unclog your PICC line.  Please return to the Emergency Department if you experience any worsening of your condition.  Thank you for allowing [place] to be a part of your care.

## 2023-01-23 NOTE — Assessment & Plan Note (Signed)
Patient is going to complete 6 weeks of antibiotic treatment.  This and on 6/1 per notes from ID.  He has been released from cardiothoracic surgery.  I have advised him that I would not suggest any vigorous exercise for at least 6 weeks.  I will have to reach out to CT surgery or ID to determine whether repeat imaging is needed.

## 2023-01-23 NOTE — Assessment & Plan Note (Signed)
Now resolved.  Patient is doing well.

## 2023-01-23 NOTE — Progress Notes (Signed)
Subjective:  Patient ID: Roger Parsons, male    DOB: [birth year]  Age: 32 y.o. 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  Patient recently developed chest pain and was seen in ER on 4/15.  He had a negative workup at that time.  Symptoms continue to persist and then he developed fever.  He was evaluated at his cardiology office and then subsequently sent to the medicine room at [facility].  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  He underwent surgical washout by CT surgery.  Initial blood cultures positive for group a strep.  Patient was placed on antibiotic therapy and this was later narrowed to penicillin but was transitioned to ceftriaxone for patient convenience.  He was discharged home with a PICC line to complete 6 weeks of IV antibiotic treatment.  Patient presents today for follow-up.  He is overall doing well.  He feels mostly back to normal.  Surgical site has healed well.  He is getting IV antibiotics at home.  No issues with the PICC line currently.  Patient curious about physical activity.  He is also inquiring about repeat imaging of the sternum.   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   Sepsis without acute organ dysfunction (HCC) 12/28/2022   Atrial fibrillation, controlled (HCC) 10/06/2021   Dilated cardiomyopathy (HCC) 03/28/2018   Dual implantable cardioverter-defibrillator in situ 11/27/2011    Social Hx   Social History   Socioeconomic History   Marital status: Single    Spouse name: Not on file    Number of children: Not on file   Years of education: Not on file   Highest education level: Not on file  Occupational History   Not on file  Tobacco Use   Smoking status: Never   Smokeless tobacco: Never  Vaping Use   Vaping Use: Never used  Substance and Sexual Activity   Alcohol use: No   Drug use: No   Sexual activity: Not on file  Other Topics Concern   Not on file  Social History Narrative   Not on file   Social Determinants of Health   Financial Resource Strain: Not on file  Food Insecurity: Not on file  Transportation Needs: Not on file  Physical Activity: Not on file  Stress: Not on file  Social Connections: Not on file    Review of Systems Per HPI  Objective:  BP 110/72   Pulse 68   Temp 98.2 F (36.8 C)   Ht 5\' 4"  (1.626 m)   Wt 201 lb (91.2 kg)   SpO2 98%   BMI 34.50 kg/m      01/23/2023    3:08 AM 01/23/2023   12:17 AM 01/23/2023   12:13 AM  BP/Weight  Systolic BP 133 132   Diastolic BP 85 84   Wt. (Lbs)   200.62  BMI   34.44 kg/m2    Physical Exam Vitals and nursing note reviewed.  Constitutional:      General: He is not in acute distress.  Appearance: Normal appearance.  HENT:     Head: Normocephalic and atraumatic.  Eyes:     General:        Right eye: No discharge.        Left eye: No discharge.     Conjunctiva/sclera: Conjunctivae normal.  Cardiovascular:     Rate and Rhythm: Normal rate and regular rhythm.  Pulmonary:     Effort: Pulmonary effort is normal.     Breath sounds: Normal breath sounds. No wheezing, rhonchi or rales.  Chest:     Chest wall: No tenderness.     Comments: Surgical site is well-healed. Neurological:     Mental Status: He is alert.  Psychiatric:        Mood and Affect: Mood normal.        Behavior: Behavior normal.     Lab Results  Component Value Date   WBC 12.7 (H) 12/24/2022   HGB 14.3 12/24/2022   HCT 41.4 12/24/2022   PLT 249 12/24/2022   GLUCOSE 124 (H) 12/24/2022   ALT 40  02/09/2018   AST 29 02/09/2018   NA 135 12/24/2022   K 3.5 12/24/2022   CL 106 12/24/2022   CREATININE 1.06 12/24/2022   BUN 12 12/24/2022   CO2 26 12/24/2022     Assessment & Plan:   Problem List Items Addressed This Visit       Musculoskeletal and Integument   Sternal osteomyelitis (HCC) - Primary    Patient is going to complete 6 weeks of antibiotic treatment.  This and on 6/1 per notes from ID.  He has been released from cardiothoracic surgery.  I have advised him that I would not suggest any vigorous exercise for at least 6 weeks.  I will have to reach out to CT surgery or ID to determine whether repeat imaging is needed.        Other   Sepsis without acute organ dysfunction (HCC)    Now resolved.  Patient is doing well.       Follow-up:  Return in about 6 weeks (around 03/05/2023).  Everlene Other DO [facility]

## 2023-01-24 ENCOUNTER — Telehealth: Payer: Self-pay

## 2023-01-24 NOTE — Transitions of Care (Post Inpatient/ED Visit) (Signed)
   01/24/2023  Name: Roger Parsons MRN: [id number] DOB: [birth year]  Today's TOC FU Call Status: TOC FU Call Complete Date: 01/24/23  Transition Care Management Follow-up Telephone Call Date of Discharge: 01/23/23 [facility] Type of Discharge: Emergency Department Reason for ED Visit: Other: (PICC line stopped up) How have you been since you were released from the hospital?: Better  Items Reviewed: Did you receive and understand the discharge instructions provided?: Yes Medications obtained,verified, and reconciled?: Yes (Medications Reviewed) Any new allergies since your discharge?: No Dietary orders reviewed?: Yes Do you have support at home?: No  Medications Reviewed Today: Medications Reviewed Today     Reviewed by Nyoka Cowden, LPN (Licensed Practical Nurse) on 01/22/23 at 1102  Med List Status: <None>   Medication Order Taking? Sig Documenting Provider Last Dose Status Informant  carvedilol (COREG) 12.5 MG tablet [id number] Yes Take 12.5 mg by mouth 2 (two) times daily with a meal. [provider] Taking Active Self  sotalol (BETAPACE) 120 MG tablet [id number]  Take 120 mg by mouth 2 (two) times daily. [provider]  Active Self            Home Care and Equipment/Supplies: Were Home Health Services Ordered?: NA Has Agency set up a time to come to your home?: No Any new equipment or medical supplies ordered?: NA Were you able to get the equipment/medical supplies?: No Do you have any questions related to the use of the equipment/supplies?: No  Functional Questionnaire: Do you need assistance with bathing/showering or dressing?: No Do you need assistance with meal preparation?: No Do you need assistance with eating?: No Do you have difficulty maintaining continence: No Do you need assistance with getting out of bed/getting out of a chair/moving?: No Do you have difficulty managing or taking your medications?: No  Follow  up appointments reviewed: PCP Follow-up appointment confirmed?: NA Specialist Hospital Follow-up appointment confirmed?: NA Do you need transportation to your follow-up appointment?: No Do you understand care options if your condition(s) worsen?: Yes-patient verbalized understanding    SIGNATURE Karena Addison, LPN [facility] Nurse Health Advisor Direct Dial [phone]

## 2023-03-05 ENCOUNTER — Ambulatory Visit: Payer: Self-pay | Admitting: Family Medicine

## 2023-03-06 ENCOUNTER — Ambulatory Visit: Payer: Self-pay | Admitting: Family Medicine

## 2023-03-08 ENCOUNTER — Ambulatory Visit: Payer: Self-pay | Admitting: Family Medicine

## 2023-03-13 ENCOUNTER — Ambulatory Visit: Payer: Self-pay | Admitting: Family Medicine

## 2023-03-13 ENCOUNTER — Encounter: Payer: Self-pay | Admitting: Family Medicine
